# Patient Record
Sex: Female | Born: 1965
Health system: Southern US, Community
[De-identification: ages and names within clinical notes are randomized; demographics above are authoritative.]

## PROBLEM LIST (undated history)

## (undated) DIAGNOSIS — T7840XA Allergy, unspecified, initial encounter: Secondary | ICD-10-CM

## (undated) HISTORY — DX: Allergy, unspecified, initial encounter: T78.40XA

---

## 2001-12-10 HISTORY — PX: ABDOMINAL HYSTERECTOMY: SHX81

## 2005-03-13 ENCOUNTER — Ambulatory Visit (HOSPITAL_COMMUNITY): Admission: RE | Admit: 2005-03-13 | Discharge: 2005-03-13 | Payer: Self-pay | Admitting: Family Medicine

## 2005-12-25 ENCOUNTER — Other Ambulatory Visit: Admission: RE | Admit: 2005-12-25 | Discharge: 2005-12-25 | Payer: Self-pay | Admitting: Family Medicine

## 2006-03-28 ENCOUNTER — Ambulatory Visit (HOSPITAL_COMMUNITY): Admission: RE | Admit: 2006-03-28 | Discharge: 2006-03-28 | Payer: Self-pay | Admitting: Family Medicine

## 2007-03-31 ENCOUNTER — Encounter: Admission: RE | Admit: 2007-03-31 | Discharge: 2007-03-31 | Payer: Self-pay | Admitting: Family Medicine

## 2008-04-14 ENCOUNTER — Ambulatory Visit (HOSPITAL_COMMUNITY): Admission: RE | Admit: 2008-04-14 | Discharge: 2008-04-14 | Payer: Self-pay | Admitting: Family Medicine

## 2009-05-19 ENCOUNTER — Ambulatory Visit: Payer: Self-pay | Admitting: Family Medicine

## 2009-05-19 DIAGNOSIS — J309 Allergic rhinitis, unspecified: Secondary | ICD-10-CM | POA: Insufficient documentation

## 2009-10-13 ENCOUNTER — Ambulatory Visit: Payer: Self-pay | Admitting: Family Medicine

## 2009-10-13 DIAGNOSIS — I868 Varicose veins of other specified sites: Secondary | ICD-10-CM | POA: Insufficient documentation

## 2009-10-17 ENCOUNTER — Encounter (INDEPENDENT_AMBULATORY_CARE_PROVIDER_SITE_OTHER): Payer: Self-pay | Admitting: *Deleted

## 2009-10-17 LAB — HM COLONOSCOPY: HM Colonoscopy: NORMAL

## 2010-02-07 ENCOUNTER — Ambulatory Visit: Payer: Self-pay | Admitting: Family Medicine

## 2010-02-07 DIAGNOSIS — H10029 Other mucopurulent conjunctivitis, unspecified eye: Secondary | ICD-10-CM | POA: Insufficient documentation

## 2010-02-08 ENCOUNTER — Telehealth: Payer: Self-pay | Admitting: Family Medicine

## 2010-03-08 ENCOUNTER — Ambulatory Visit (HOSPITAL_COMMUNITY): Admission: RE | Admit: 2010-03-08 | Discharge: 2010-03-08 | Payer: Self-pay | Admitting: Family Medicine

## 2011-01-02 ENCOUNTER — Encounter: Payer: Self-pay | Admitting: Family Medicine

## 2011-01-06 ENCOUNTER — Encounter: Payer: Self-pay | Admitting: Family Medicine

## 2011-01-09 NOTE — Progress Notes (Signed)
Summary: vomiting related to Hydromet?  Phone Note Call from Patient   Caller: Patient Call For: Evelena Peat MD Summary of Call: [phone] Pt has been vomiting since taking the Hydromet.  Can she have another Rx? [facility] ([place]) Initial call taken by: Lynann Beaver CMA,  February 08, 2010 10:02 AM  Follow-up for Phone Call        Yes.  Try Tessalon Perles 200 mg one by mouth q 8 hours as needed cough, #30 with one refill. Follow-up by: Evelena Peat MD,  February 08, 2010 10:23 AM    New/Updated Medications: TESSALON 200 MG CAPS (BENZONATATE) one by mouth q 8 hours Prescriptions: TESSALON 200 MG CAPS (BENZONATATE) one by mouth q 8 hours  #30 x 0   Entered by:   Lynann Beaver CMA   Authorized by:   Evelena Peat MD   Signed by:   Lynann Beaver CMA on 02/08/2010   Method used:   Electronically to        [id number] (retail)       [address]       [place], [place]  [address]       Ph: [phone]       Fax: [phone]   RxID:   [id number]  Pt. notified.

## 2011-01-09 NOTE — Assessment & Plan Note (Signed)
Summary: pink eye//ccm   Vital Signs:  Patient profile:   45 year old female Menstrual status:  hysterectomy 2003 Temp:     98.9 degrees F oral BP sitting:   120 / 80  (left arm)  Vitals Entered By: Sid Falcon LPN (February 07, 346 11:40 AM) CC: pink eye   History of Present Illness: Patient seen with eye irritation mostly left past few days. Husband has conjunctivitis. 4 day hx redness and matting and drainage from the left eye. No blurred vision. No contact use. No known drug allergies.  Other issue is she had viral URI symptoms last week. Has persistent cough mostly nonproductive. No relief with over-the-counter Robitussin-DM. Denies fever. Nonsmoker.  Allergies (verified): No Known Drug Allergies  Past History:  Past Medical History: Last updated: 05/19/2009 Frequent headaches/Migraines Genital warts Allergic rhinitis  Review of Systems      See HPI  Physical Exam  General:  Well-developed,well-nourished,in no acute distress; alert,appropriate and cooperative throughout examination Eyes:  left conjunctiva erythematous right minimally erythematous. No purulent drainage at this time. Pupils equal reactive to light. Cornea appears normal Ears:  External ear exam shows no significant lesions or deformities.  Otoscopic examination reveals clear canals, tympanic membranes are intact bilaterally without bulging, retraction, inflammation or discharge. Hearing is grossly normal bilaterally. Mouth:  Oral mucosa and oropharynx without lesions or exudates.  Teeth in good repair. Neck:  No deformities, masses, or tenderness noted. Lungs:  Normal respiratory effort, chest expands symmetrically. Lungs are clear to auscultation, no crackles or wheezes. Heart:  Normal rate and regular rhythm. S1 and S2 normal without gallop, murmur, click, rub or other extra sounds.   Impression & Recommendations:  Problem # 1:  CONJUNCTIVITIS, BACTERIAL (ICD-372.03)  Her updated medication list  for this problem includes:    Polytrim 10000-0.1 Unit/ml-% Soln (Polymyxin b-trimethoprim) .Marland Kitchen... 2 drops ou q 4 hours while awake for 5 days  Problem # 2:  COUGH (ICD-786.2) hycodan for nightime symptom relief.  Complete Medication List: 1)  Flonase 50 Mcg/act Susp (Fluticasone propionate) .... Two times a day as needed 2)  Allegra 180 Mg Tabs (Fexofenadine hcl) .... Once daily 3)  Polytrim 10000-0.1 Unit/ml-% Soln (Polymyxin b-trimethoprim) .... 2 drops ou q 4 hours while awake for 5 days 4)  Hydrocodone-homatropine 5-1.5 Mg/55ml Syrp (Hydrocodone-homatropine) .... One tsp by mouth q 4-6 hours as needed cough  Patient Instructions: 1)  Clean any discharge from eyelids with baby shampoo and warm water. Be sure to wash hands often to avoid spreading and reinfection. If you wear contacts, remove them and wear glasses until infection resolved( be sure and clean lenses before replacing).  Prescriptions: POLYTRIM 10000-0.1 UNIT/ML-% SOLN (POLYMYXIN B-TRIMETHOPRIM) 2 drops OU q 4 hours while awake for 5 days  #47ml x 1   Entered and Authorized by:   Evelena Peat MD   Signed by:   Evelena Peat MD on 02/07/2010   Method used:   Electronically to        [id number] (retail)       [address]       [place], [place]  [address]       Ph: [phone]       Fax: [phone]   RxID:   [id number] HYDROCODONE-HOMATROPINE 5-1.5 MG/5ML SYRP (HYDROCODONE-HOMATROPINE) one tsp by mouth q 4-6 hours as needed cough  #120 ml x 0   Entered and Authorized by:   Evelena Peat MD   Signed by:  Evelena Peat MD on 02/07/2010   Method used:   Print then Give to Patient   RxID:   [id number]

## 2011-01-10 ENCOUNTER — Ambulatory Visit (INDEPENDENT_AMBULATORY_CARE_PROVIDER_SITE_OTHER): Payer: PRIVATE HEALTH INSURANCE | Admitting: Family Medicine

## 2011-01-10 ENCOUNTER — Encounter: Payer: Self-pay | Admitting: Family Medicine

## 2011-01-10 DIAGNOSIS — J309 Allergic rhinitis, unspecified: Secondary | ICD-10-CM

## 2011-01-10 MED ORDER — FLUTICASONE PROPIONATE 50 MCG/ACT NA SUSP: 2.0000 | Freq: Every day | NASAL | Status: DC

## 2011-01-10 NOTE — Patient Instructions (Signed)
Consider scheduling follow up for CPE later this year.

## 2011-01-10 NOTE — Progress Notes (Signed)
Patient is here for routine medical followup. She has history of perennial allergies.  On Flonase and over-the-counter Zyrtec. Symptoms generally well-controlled. Conjunctivitis last visit fully resolved. Patient has no specific complaints today otherwise. Allergies manifested clear nasal discharge and occasional sneezing. Rare symptoms. No history of asthma. Ex-smoker.  No CPE in several years.  She is willing to schedule.  ROS- denies any fever, chills, cough, dyspnea, chest pain.  PE-HEENT exam unremarkable. Oropharynx clear. TMs normal. Neck no adenopathy. Chest clear to auscultation. Heart regular rhythm and rate with no murmur. Skin no rashes.

## 2011-01-10 NOTE — Progress Notes (Deleted)
  Subjective:    Patient ID: Ana Rivera, female    DOB: Nov 19, 1966, 45 y.o.   MRN: [id number]  HPI    Review of Systems     Objective:   Physical Exam        Assessment & Plan:

## 2011-01-10 NOTE — Progress Notes (Signed)
Addended by: Alfred Levins on: 01/10/2011 09:47 AM   Modules accepted: Orders

## 2011-03-30 ENCOUNTER — Encounter: Payer: Self-pay | Admitting: Internal Medicine

## 2011-03-30 ENCOUNTER — Ambulatory Visit (INDEPENDENT_AMBULATORY_CARE_PROVIDER_SITE_OTHER): Payer: PRIVATE HEALTH INSURANCE | Admitting: Internal Medicine

## 2011-03-30 VITALS — BP 120/80 | HR 103 | Ht 60.0 in | Wt 162.0 lb

## 2011-03-30 DIAGNOSIS — R1031 Right lower quadrant pain: Secondary | ICD-10-CM

## 2011-03-30 DIAGNOSIS — R3915 Urgency of urination: Secondary | ICD-10-CM

## 2011-03-30 DIAGNOSIS — R109 Unspecified abdominal pain: Secondary | ICD-10-CM

## 2011-03-30 LAB — POCT URINALYSIS DIPSTICK
Bilirubin, UA: NEGATIVE
Glucose, UA: NEGATIVE
Ketones, UA: NEGATIVE
Nitrite, UA: NEGATIVE
Protein, UA: NEGATIVE
Spec Grav, UA: 1.01
Urobilinogen, UA: 0.2
pH, UA: 6.5

## 2011-03-30 MED ORDER — CIPROFLOXACIN HCL 500 MG PO TABS: 500.0000 mg | ORAL_TABLET | Freq: Two times a day (BID) | ORAL | Status: AC

## 2011-03-30 NOTE — Progress Notes (Signed)
  Subjective:    Patient ID: Ana Rivera, female    DOB: Aug 02, 1966, 45 y.o.   MRN: [id number]  HPI Patient presents to clinic for evaluation of bdominal discomfort.Notes 24 hour history of bilateral lower quadrant discomfort without radiation.As noted also 24-hour history of urinary urgency without increase in frequency, dysuria or hematuria. Denies fever or chills. Has undergone partial hysterectomy in the past for noncancerous indication. Urinalysis obtained and reviewed with patient.Noted presence of +1 blood and trace leukocyte esterase. No exacerbating or alleviating factors. Patient is taking no medication for this problem. No other complaints.  Reviewed past medical history, medications and allergies    Review of Systems see history of present illness     Objective:   Physical Exam  Nursing note and vitals reviewed. Constitutional: She appears well-developed and well-nourished. No distress.  HENT:  Head: Normocephalic and atraumatic.  Abdominal: Soft. Bowel sounds are normal. She exhibits no distension and no mass. There is no tenderness. There is no rebound and no guarding.  Skin: Skin is warm. She is not diaphoretic.          Assessment & Plan:

## 2011-03-31 DIAGNOSIS — R1031 Right lower quadrant pain: Secondary | ICD-10-CM | POA: Insufficient documentation

## 2011-03-31 NOTE — Assessment & Plan Note (Signed)
Possible UTI. Begin antibiotics as directed to completion. If symptoms persist consider elvic ultrasound. Followup if no improvement or worsening.

## 2011-04-06 ENCOUNTER — Telehealth: Payer: Self-pay | Admitting: *Deleted

## 2011-04-06 NOTE — Telephone Encounter (Signed)
Pt has finished antibiotics.

## 2011-04-06 NOTE — Telephone Encounter (Signed)
Pt wants Dr. Rodena Medin to know she does not think she has an UTI.  She feels it may be GI, and she plans to make an appt for a CPX.

## 2011-04-06 NOTE — Telephone Encounter (Signed)
That's fine but pls take abx to completion. Helps [place] decide if we need further tests like [place] we talked about.

## 2011-05-03 ENCOUNTER — Encounter: Payer: Self-pay | Admitting: Family Medicine

## 2011-05-03 ENCOUNTER — Ambulatory Visit (INDEPENDENT_AMBULATORY_CARE_PROVIDER_SITE_OTHER): Payer: PRIVATE HEALTH INSURANCE | Admitting: Family Medicine

## 2011-05-03 VITALS — BP 92/68 | HR 92 | Temp 98.7°F | Resp 12 | Ht 60.0 in | Wt 159.0 lb

## 2011-05-03 DIAGNOSIS — Z0000 Encounter for general adult medical examination without abnormal findings: Secondary | ICD-10-CM

## 2011-05-03 LAB — HEPATIC FUNCTION PANEL
Alkaline Phosphatase: 53 U/L (ref 39–117)
Bilirubin, Direct: 0.1 mg/dL (ref 0.0–0.3)

## 2011-05-03 LAB — CBC WITH DIFFERENTIAL/PLATELET
Eosinophils Relative: 1.5 % (ref 0.0–5.0)
Hemoglobin: 13.5 g/dL (ref 12.0–15.0)
Lymphs Abs: 1.8 10*3/uL (ref 0.7–4.0)
MCHC: 34.8 g/dL (ref 30.0–36.0)
Monocytes Absolute: 0.6 10*3/uL (ref 0.1–1.0)
Monocytes Relative: 7.7 % (ref 3.0–12.0)
Platelets: 321 10*3/uL (ref 150.0–400.0)
WBC: 7.3 10*3/uL (ref 4.5–10.5)

## 2011-05-03 LAB — LIPID PANEL
Cholesterol: 156 mg/dL (ref 0–200)
HDL: 57.6 mg/dL (ref >39.00)
Triglycerides: 66 mg/dL (ref 0.0–149.0)
VLDL: 13.2 mg/dL (ref 0.0–40.0)

## 2011-05-03 LAB — BASIC METABOLIC PANEL
Chloride: 108 mEq/L (ref 96–112)
Creatinine, Ser: 0.8 mg/dL (ref 0.4–1.2)
GFR: 79.61 mL/min (ref >60.00)
Glucose, Bld: 95 mg/dL (ref 70–99)

## 2011-05-03 NOTE — Progress Notes (Signed)
  Subjective:    Patient ID: Ana Rivera, female    DOB: Aug 11, 1966, 45 y.o.   MRN: [id number]  HPI Patient here for complete physical examination. She has history of allergic rhinitis and no other chronic medical problems. Last tetanus 2004. Prior colonoscopy 2010. Last mammogram last year. Needs repeat.  Social history is that she is married. No children. Nonsmoker. Occasional alcohol use. Family history is reviewed as recorded elsewhere.  Patient is exercising most days of the week. No specific complaints. Prior hysterectomy for benign disease so no indication for Pap smear  Past Medical History  Diagnosis Date  . Allergy    Past Surgical History  Procedure Date  . Abdominal hysterectomy 2003    reports that she has never smoked. She does not have any smokeless tobacco history on file. Her alcohol and drug histories not on file. family history includes Cancer (age of onset:65) in her father; Cancer (age of onset:68) in her mother; and Heart disease (age of onset:70) in her father. Allergies  Allergen Reactions  . Codeine Nausea And Vomiting      Review of Systems  Constitutional: Negative for fever, activity change, appetite change and fatigue.  HENT: Negative for hearing loss, ear pain, sore throat and trouble swallowing.   Eyes: Negative for visual disturbance.  Respiratory: Negative for cough and shortness of breath.   Cardiovascular: Negative for chest pain and palpitations.  Gastrointestinal: Negative for abdominal pain, diarrhea, constipation and blood in stool.  Genitourinary: Negative for dysuria and hematuria.  Musculoskeletal: Negative for myalgias, back pain and arthralgias.  Skin: Negative for rash.  Neurological: Negative for dizziness, syncope and headaches.  Hematological: Negative for adenopathy.  Psychiatric/Behavioral: Negative for confusion and dysphoric mood.       Objective:   Physical Exam  Constitutional: She is oriented to person, place, and  time. She appears well-developed and well-nourished.  HENT:  Head: Normocephalic and atraumatic.  Eyes: EOM are normal. Pupils are equal, round, and reactive to light.  Neck: Normal range of motion. Neck supple. No thyromegaly present.  Cardiovascular: Normal rate, regular rhythm and normal heart sounds.   No murmur heard. Pulmonary/Chest: Breath sounds normal. No respiratory distress. She has no wheezes. She has no rales.  Abdominal: Soft. Bowel sounds are normal. She exhibits no distension and no mass. There is no tenderness. There is no rebound and no guarding.  Musculoskeletal: Normal range of motion. She exhibits no edema.  Lymphadenopathy:    She has no cervical adenopathy.  Neurological: She is alert and oriented to person, place, and time. She displays normal reflexes. No cranial nerve deficit.  Skin: No rash noted.  Psychiatric: She has a normal mood and affect. Her behavior is normal. Judgment and thought content normal.          Assessment & Plan:  Physical examination. Schedule mammogram. Regular exercise. Recommended consideration for tetanus and she prefers to wait until next year. Obtain screening lab work.

## 2011-05-03 NOTE — Patient Instructions (Signed)
Schedule repeat mammogram. Continue regular exercise. Consider tetanus booster next year

## 2011-05-04 NOTE — Progress Notes (Signed)
Quick Note:  Spoke with pt , informed of lab results .KIK ______

## 2011-05-10 ENCOUNTER — Other Ambulatory Visit: Payer: Self-pay | Admitting: Family Medicine

## 2011-05-10 DIAGNOSIS — Z1231 Encounter for screening mammogram for malignant neoplasm of breast: Secondary | ICD-10-CM

## 2011-05-22 ENCOUNTER — Ambulatory Visit (HOSPITAL_COMMUNITY)
Admission: RE | Admit: 2011-05-22 | Discharge: 2011-05-22 | Disposition: A | Discharge status: Home / Self Care | Payer: PRIVATE HEALTH INSURANCE | Source: Ambulatory Visit | Attending: Family Medicine | Admitting: Family Medicine

## 2011-05-22 DIAGNOSIS — Z1231 Encounter for screening mammogram for malignant neoplasm of breast: Secondary | ICD-10-CM | POA: Insufficient documentation

## 2011-12-17 ENCOUNTER — Encounter: Payer: Self-pay | Admitting: Family Medicine

## 2011-12-17 ENCOUNTER — Telehealth: Payer: Self-pay | Admitting: Family Medicine

## 2011-12-17 ENCOUNTER — Ambulatory Visit (INDEPENDENT_AMBULATORY_CARE_PROVIDER_SITE_OTHER): Payer: PRIVATE HEALTH INSURANCE | Admitting: Family Medicine

## 2011-12-17 VITALS — BP 118/70 | Temp 98.8°F | Wt 161.0 lb

## 2011-12-17 DIAGNOSIS — J209 Acute bronchitis, unspecified: Secondary | ICD-10-CM

## 2011-12-17 DIAGNOSIS — E669 Obesity, unspecified: Secondary | ICD-10-CM

## 2011-12-17 MED ORDER — PHENTERMINE HCL 37.5 MG PO CAPS: 37.5000 mg | ORAL_CAPSULE | ORAL | Status: DC

## 2011-12-17 MED ORDER — HYDROCODONE-HOMATROPINE 5-1.5 MG/5ML PO SYRP: 5.0000 mL | ORAL_SOLUTION | Freq: Four times a day (QID) | ORAL | Status: AC | PRN

## 2011-12-17 NOTE — Telephone Encounter (Signed)
Pt was in earlier today was was prescribed Hydro condone cough syrup pt is worried that she is allergic and is requesting you contact her

## 2011-12-17 NOTE — Patient Instructions (Signed)

## 2011-12-17 NOTE — Telephone Encounter (Signed)
Pt had trouble with Hycodan cough syrup in March 2011, caused nausea and vomiting, reported to office and was given Tessalon.  Pt is experiencing some headache, mild nausea.  She is not sure if she was sensitive to the cough syrup or also stomach bug last year.  I will call pt tomorrow to see how she is doing.

## 2011-12-17 NOTE — Progress Notes (Signed)
  Subjective:    Patient ID: Ana Rivera, female    DOB: 1965/12/31, 46 y.o.   MRN: [id number]  HPI  Patient seen with non-history of dry cough. Minimal nasal congestion. Intermittent body aches. Denies any fever or chills. No dyspnea. No orthopnea. Patient nonsmoker.  She's had challenge with weight loss for several months/years. She has been involved with regular exercise without improvement. Requesting specifically phentermine. She has never tried this previously. No history of hypertension.  She has tried calorie restricted diets without improvement.  Past Medical History  Diagnosis Date  . Allergy    Past Surgical History  Procedure Date  . Abdominal hysterectomy 2003    reports that she has never smoked. She does not have any smokeless tobacco history on file. Her alcohol and drug histories not on file. family history includes Cancer (age of onset:65) in her father; Cancer (age of onset:68) in her mother; and Heart disease (age of onset:70) in her father. Allergies  Allergen Reactions  . Codeine Nausea And Vomiting      Review of Systems  Constitutional: Negative for fever, chills, appetite change and fatigue.  Respiratory: Positive for cough. Negative for shortness of breath and wheezing.   Cardiovascular: Negative for chest pain, palpitations and leg swelling.  Gastrointestinal: Negative for abdominal pain.  Neurological: Negative for dizziness, syncope, weakness and headaches.  Psychiatric/Behavioral: Negative for dysphoric mood and agitation.       Objective:   Physical Exam  Constitutional: She appears well-developed and well-nourished.  HENT:  Right Ear: External ear normal.  Left Ear: External ear normal.  Mouth/Throat: Oropharynx is clear and moist.  Neck: Neck supple.  Cardiovascular: Normal rate and regular rhythm.   Pulmonary/Chest: Effort normal and breath sounds normal. No respiratory distress. She has no wheezes. She has no rales.  Musculoskeletal:  She exhibits no edema.  Lymphadenopathy:    She has no cervical adenopathy.          Assessment & Plan:  #1 acute viral bronchitis. Hycodan cough syrup 1 teaspoon every 6 hours for cough suppression. Cautioned about possible sedation. Follow up promptly for any fever worsening symptoms #2 overweight. Discussed options. Discussed importance of diet and regular exercise. Requesting phentermine. We explained possible side effects and potential risk. We agreed to 37.5 mg once daily and reassess in one month. She is aware this is only for short-term use and does not take the place of diet and exercise

## 2011-12-18 NOTE — Telephone Encounter (Signed)
I spoke with pt.  She does not want to try the Hycodan cough syrup because she had such a headache after one dose.  She took some Nightquil and slept pretty good.  She also has Tessalon Pearls left from last years cough and will use those today.  Will correct allergy on chart to read Hydrocodone.

## 2012-01-15 ENCOUNTER — Ambulatory Visit (INDEPENDENT_AMBULATORY_CARE_PROVIDER_SITE_OTHER): Payer: PRIVATE HEALTH INSURANCE | Admitting: Family Medicine

## 2012-01-15 ENCOUNTER — Encounter: Payer: Self-pay | Admitting: Family Medicine

## 2012-01-15 VITALS — BP 120/80 | Temp 98.4°F | Wt 157.0 lb

## 2012-01-15 DIAGNOSIS — E669 Obesity, unspecified: Secondary | ICD-10-CM

## 2012-01-15 MED ORDER — PHENTERMINE HCL 37.5 MG PO CAPS: 37.5000 mg | ORAL_CAPSULE | ORAL | Status: DC

## 2012-01-15 NOTE — Progress Notes (Signed)
  Subjective:    Patient ID: Ana Rivera, female    DOB: Apr 11, 1966, 46 y.o.   MRN: [id number]  HPI  Followup for recent initiation of phentermine for weight loss. No side effects other than minimal dry mouth. Blood pressure stable. No palpitations. No chest pains. No headaches. She has lost 4 pounds. She is combining medication with diet and exercise. Feels better overall.  Past Medical History  Diagnosis Date  . Allergy    Past Surgical History  Procedure Date  . Abdominal hysterectomy 2003    reports that she has never smoked. She does not have any smokeless tobacco history on file. Her alcohol and drug histories not on file. family history includes Cancer (age of onset:65) in her father; Cancer (age of onset:68) in her mother; and Heart disease (age of onset:70) in her father. Allergies  Allergen Reactions  . Hydrocodone     Hycodan cough syrup caused vomiting and headache      Review of Systems  Respiratory: Negative for cough and shortness of breath.   Cardiovascular: Negative for chest pain and palpitations.  Neurological: Negative for headaches.       Objective:   Physical Exam  Constitutional: She appears well-developed and well-nourished. No distress.  Neck: Neck supple. No thyromegaly present.  Cardiovascular: Normal rate and regular rhythm.   Pulmonary/Chest: Effort normal and breath sounds normal. No respiratory distress. She has no wheezes. She has no rales.          Assessment & Plan:  Obesity. Improving with phentermine with no adverse side effects. We'll continue for 3-4 months then vacation off medication. Continue diet and exercise

## 2012-02-02 ENCOUNTER — Other Ambulatory Visit: Payer: Self-pay | Admitting: Family Medicine

## 2012-02-15 ENCOUNTER — Telehealth: Payer: Self-pay | Admitting: Family Medicine

## 2012-02-15 MED ORDER — PHENTERMINE HCL 37.5 MG PO CAPS: 37.5000 mg | ORAL_CAPSULE | ORAL | Status: DC

## 2012-02-15 NOTE — Telephone Encounter (Signed)
Refill once 

## 2012-02-15 NOTE — Telephone Encounter (Signed)
Please advise, was given #30 only at [facility] in Feb.

## 2012-02-15 NOTE — Telephone Encounter (Signed)
Pt informed ready to pick up

## 2012-02-15 NOTE — Telephone Encounter (Signed)
Pt called req refill for phentermine 37.5 MG capsule. Pls call pt when ready for pick up.

## 2012-04-16 ENCOUNTER — Telehealth: Payer: Self-pay | Admitting: Family Medicine

## 2012-04-16 NOTE — Telephone Encounter (Signed)
Pt called req refill of phentermine 37.5 MG capsule to [facility] [address]

## 2012-04-17 MED ORDER — PHENTERMINE HCL 37.5 MG PO CAPS: 37.5000 mg | ORAL_CAPSULE | ORAL | Status: DC

## 2012-04-17 NOTE — Telephone Encounter (Signed)
Refill once 

## 2012-06-05 ENCOUNTER — Other Ambulatory Visit: Payer: Self-pay | Admitting: *Deleted

## 2012-06-05 NOTE — Telephone Encounter (Signed)
Refill once.  Will need office follow up prior to further refills.

## 2012-06-05 NOTE — Telephone Encounter (Signed)
Phentermine, 1 cap daily every morning.  Last filled 04-17-12, #30 with 0 refills

## 2012-06-06 MED ORDER — PHENTERMINE HCL 37.5 MG PO CAPS: 37.5000 mg | ORAL_CAPSULE | ORAL | Status: DC

## 2012-06-24 ENCOUNTER — Other Ambulatory Visit: Payer: Self-pay | Admitting: Family Medicine

## 2012-06-24 DIAGNOSIS — Z1231 Encounter for screening mammogram for malignant neoplasm of breast: Secondary | ICD-10-CM

## 2012-07-08 ENCOUNTER — Ambulatory Visit (INDEPENDENT_AMBULATORY_CARE_PROVIDER_SITE_OTHER): Payer: PRIVATE HEALTH INSURANCE | Admitting: Family Medicine

## 2012-07-08 ENCOUNTER — Encounter: Payer: Self-pay | Admitting: Family Medicine

## 2012-07-08 VITALS — BP 118/78 | Temp 98.5°F | Wt 150.0 lb

## 2012-07-08 DIAGNOSIS — E663 Overweight: Secondary | ICD-10-CM

## 2012-07-08 MED ORDER — PHENTERMINE HCL 37.5 MG PO CAPS: 37.5000 mg | ORAL_CAPSULE | ORAL | Status: DC

## 2012-07-08 NOTE — Progress Notes (Signed)
  Subjective:    Patient ID: Ana Rivera, female    DOB: 09-Aug-1966, 46 y.o.   MRN: [id number]  HPI  Followup phentermine therapy. Patient tolerated well. No headaches. No chest pains. No dyspnea. She is exercising with walking. She has lost about 11 pounds since starting this. She's taking this somewhat intermittently. She knows this is not a long-term treatment option.  Review of Systems  Respiratory: Negative for shortness of breath.   Cardiovascular: Negative for chest pain and palpitations.  Neurological: Negative for headaches.       Objective:   Physical Exam  Constitutional: She appears well-developed and well-nourished.  Neck: Neck supple. No thyromegaly present.  Cardiovascular: Normal rate and regular rhythm.  Exam reveals no gallop.   No murmur heard. Pulmonary/Chest: Effort normal and breath sounds normal. No respiratory distress. She has no wheezes. She has no rales.          Assessment & Plan:  Overweight. Patient is having excellent success with weight loss. We have advised of importance of diet and exercise as first line. 1 refill phentermine 37.5 mg once daily. She will leave off after her current prescription runs out.

## 2012-07-16 ENCOUNTER — Ambulatory Visit (HOSPITAL_COMMUNITY)
Admission: RE | Admit: 2012-07-16 | Discharge: 2012-07-16 | Disposition: A | Discharge status: Home / Self Care | Payer: PRIVATE HEALTH INSURANCE | Source: Ambulatory Visit | Attending: Family Medicine | Admitting: Family Medicine

## 2012-07-16 DIAGNOSIS — Z1231 Encounter for screening mammogram for malignant neoplasm of breast: Secondary | ICD-10-CM | POA: Insufficient documentation

## 2012-08-21 ENCOUNTER — Other Ambulatory Visit: Payer: Self-pay | Admitting: *Deleted

## 2012-08-21 MED ORDER — PHENTERMINE HCL 37.5 MG PO CAPS: 37.5000 mg | ORAL_CAPSULE | ORAL | Status: DC

## 2012-08-21 NOTE — Telephone Encounter (Signed)
Phentermine last filled 07/08/12, #30 with 0 refills

## 2012-08-21 NOTE — Telephone Encounter (Signed)
Refill OK

## 2012-10-27 ENCOUNTER — Ambulatory Visit: Payer: PRIVATE HEALTH INSURANCE | Admitting: Family Medicine

## 2012-11-03 ENCOUNTER — Ambulatory Visit: Payer: PRIVATE HEALTH INSURANCE

## 2012-11-03 ENCOUNTER — Ambulatory Visit: Payer: Self-pay | Admitting: Family Medicine

## 2012-11-03 ENCOUNTER — Ambulatory Visit (INDEPENDENT_AMBULATORY_CARE_PROVIDER_SITE_OTHER): Payer: PRIVATE HEALTH INSURANCE | Admitting: Family Medicine

## 2012-11-03 ENCOUNTER — Telehealth: Payer: Self-pay | Admitting: Family Medicine

## 2012-11-03 VITALS — BP 156/86 | HR 100 | Temp 98.4°F | Resp 18 | Ht 60.5 in | Wt 152.0 lb

## 2012-11-03 DIAGNOSIS — S93609A Unspecified sprain of unspecified foot, initial encounter: Secondary | ICD-10-CM

## 2012-11-03 DIAGNOSIS — M79672 Pain in left foot: Secondary | ICD-10-CM

## 2012-11-03 DIAGNOSIS — S93602A Unspecified sprain of left foot, initial encounter: Secondary | ICD-10-CM

## 2012-11-03 DIAGNOSIS — M79609 Pain in unspecified limb: Secondary | ICD-10-CM

## 2012-11-03 NOTE — Telephone Encounter (Signed)
Patient Information:  Caller Name: Rannah  Phone: [phone]  Patient: Ana Rivera, Ana Rivera  Gender: Female  DOB: 05/06/1966  Age: 46 Years  PCP: Evelena Peat (Family Practice)  Pregnant: No   Symptoms  Reason For Call & Symptoms: Larey Seat on Saturday 11/23-missing a step and injured left foot.  Foot hurts not ankle.  Reviewed Health History In EMR: Yes  Reviewed Medications In EMR: Yes  Reviewed Allergies In EMR: Yes  Date of Onset of Symptoms: 11/01/2012  Treatments Tried: Anti-inflammatory  Treatments Tried Worked: Yes OB:  LMP: Unknown  Guideline(s) Used:  Foot Pain  Ankle and Foot Injury  Disposition Per Guideline:   See Today in Office  Reason For Disposition Reached:   Large swelling or bruise and size > palm of person's hand  Advice Given:  Apply a Cold Pack:  Apply a cold pack or an ice bag (wrapped in a moist towel) to the area for 20 minutes. Repeat in 1 hour, then every 4 hours while awake.  Reassurance - Direct Blow (Contusion, Bruise)  Symptoms are mild pain, swelling, and/or bruising.  Apply Heat to the Area:  Beginning 48 hours after an injury, apply a warm washcloth or heating pad for 10 minutes three times a day.  Wrap with an Elastic Bandage:  The pressure from the bandage can make it feel better and help prevent swelling.  Office Follow Up:  Does the office need to follow up with this patient?: No  Instructions For The Office: N/A  Appointment Scheduled:  11/03/2012 14:30:00  RN Note:  Rates foot pain at rest a 2/10 and walking 4-5/10.  It is swollen and purple with more swelling toward ankle bone.  Patient has it wrapped and elevated.

## 2012-11-03 NOTE — Patient Instructions (Addendum)
Postop shoe Ibuprofen Return if worse

## 2012-11-03 NOTE — Progress Notes (Signed)
Subjective: Patient was out of town this weekend and in the dark she missed the bottom step. She twisted her ankle and foot and fell. She had pain in the left foot, primarily in the midfoot area. There is been some swelling and discomfort. She did not ice it, but was in a cold football game all day. 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 She's been trying not to stand on it excessively.  Objective: She has broken her wrist a few years ago. Her left ankle joint seems normal with no tenderness of the malleolus. He midfoot is tender and a little edematous on the proximal fourth and fifth metatarsal area primarily. Slightly tender at the proximal first and fifth metatarsal.. Neurovascular is normal. Movement of the toes is normal. She can flex and extend the ankle.  [facility] reading (PRIMARY) by  Dr. Sofie Rower reveals no fracture.   Assessment: Sprain and strain of the midfoot: Left.  Plan: Postop shoe Ibuprofen Return if worse

## 2012-11-03 NOTE — Telephone Encounter (Signed)
No follow up required, closing encounter. °

## 2012-12-07 ENCOUNTER — Other Ambulatory Visit: Payer: Self-pay | Admitting: Family Medicine

## 2013-04-06 ENCOUNTER — Encounter: Payer: Self-pay | Admitting: Family Medicine

## 2013-04-06 ENCOUNTER — Ambulatory Visit (INDEPENDENT_AMBULATORY_CARE_PROVIDER_SITE_OTHER): Payer: BC Managed Care – PPO | Admitting: Family Medicine

## 2013-04-06 VITALS — BP 120/70 | Temp 98.9°F

## 2013-04-06 DIAGNOSIS — J019 Acute sinusitis, unspecified: Secondary | ICD-10-CM

## 2013-04-06 MED ORDER — AZITHROMYCIN 250 MG PO TABS: ORAL_TABLET | ORAL | Status: AC

## 2013-04-06 NOTE — Progress Notes (Signed)
  Subjective:    Patient ID: Ana Rivera, female    DOB: 04-08-1966, 47 y.o.   MRN: [id number]  HPI Acute visit Almost three-week history of bifrontal and ethmoid sinus congestion bilaterally. She's had some thick mucus off and on. More frequent headaches. Denies fever or chills. No significant cough. No sore throat. She's tried Advil in Sudafed with minimal improvement  Just some seasonal allergies and uses Flonase and Zyrtec for that.  Past Medical History  Diagnosis Date  . Allergy    Past Surgical History  Procedure Laterality Date  . Abdominal hysterectomy  2003    reports that she has never smoked. She does not have any smokeless tobacco history on file. She reports that  drinks alcohol. Her drug history is not on file. family history includes Cancer (age of onset: 76) in her father; Cancer (age of onset: 19) in her mother; Diabetes in her maternal grandmother; and Heart disease (age of onset: 79) in her father. Allergies  Allergen Reactions  . Hydrocodone     Hycodan cough syrup caused vomiting and headache      Review of Systems  Constitutional: Positive for fatigue. Negative for fever and chills.  HENT: Positive for congestion and sinus pressure.   Respiratory: Negative for cough, shortness of breath and wheezing.   Neurological: Positive for headaches.       Objective:   Physical Exam  Constitutional: She appears well-developed and well-nourished.  HENT:  Right Ear: External ear normal.  Left Ear: External ear normal.  Mouth/Throat: Oropharynx is clear and moist.  Erythematous nasal mucosa otherwise normal  Neck: Neck supple.  Cardiovascular: Normal rate and regular rhythm.   Pulmonary/Chest: Effort normal and breath sounds normal. No respiratory distress. She has no wheezes. She has no rales.  Lymphadenopathy:    She has no cervical adenopathy.          Assessment & Plan:  Probable acute sinusitis. Zithromax for 5 days. Consider Netti pot for  nasal saline irrigation

## 2013-04-06 NOTE — Patient Instructions (Addendum)

## 2013-07-22 ENCOUNTER — Telehealth: Payer: Self-pay

## 2013-07-22 MED ORDER — PHENTERMINE HCL 37.5 MG PO CAPS: 37.5000 mg | ORAL_CAPSULE | ORAL | Status: DC

## 2013-07-22 NOTE — Telephone Encounter (Signed)
Phentermine 37.5 mg cap Take one capsule by mouth every morning  Last visit 04/06/13 08/21/12 last refill #30 no refill  [facility]

## 2013-07-22 NOTE — Telephone Encounter (Signed)
Phoned in to pharmacy. 

## 2013-07-22 NOTE — Telephone Encounter (Signed)
Refill once.  Needs office follow up to reassess

## 2013-08-14 ENCOUNTER — Other Ambulatory Visit: Payer: Self-pay | Admitting: Family Medicine

## 2013-08-14 DIAGNOSIS — Z1231 Encounter for screening mammogram for malignant neoplasm of breast: Secondary | ICD-10-CM

## 2013-08-20 ENCOUNTER — Ambulatory Visit (HOSPITAL_COMMUNITY): Payer: BC Managed Care – PPO

## 2013-08-25 ENCOUNTER — Ambulatory Visit (HOSPITAL_COMMUNITY)
Admission: RE | Admit: 2013-08-25 | Discharge: 2013-08-25 | Disposition: A | Discharge status: Home / Self Care | Payer: BC Managed Care – PPO | Source: Ambulatory Visit | Attending: Family Medicine | Admitting: Family Medicine

## 2013-08-25 DIAGNOSIS — Z1231 Encounter for screening mammogram for malignant neoplasm of breast: Secondary | ICD-10-CM | POA: Insufficient documentation

## 2013-10-16 ENCOUNTER — Encounter: Payer: Self-pay | Admitting: Family Medicine

## 2013-10-16 ENCOUNTER — Ambulatory Visit (INDEPENDENT_AMBULATORY_CARE_PROVIDER_SITE_OTHER): Payer: BC Managed Care – PPO | Admitting: Family Medicine

## 2013-10-16 VITALS — BP 140/78 | HR 106 | Temp 98.3°F | Wt 157.0 lb

## 2013-10-16 DIAGNOSIS — J309 Allergic rhinitis, unspecified: Secondary | ICD-10-CM

## 2013-10-16 DIAGNOSIS — G43909 Migraine, unspecified, not intractable, without status migrainosus: Secondary | ICD-10-CM | POA: Insufficient documentation

## 2013-10-16 MED ORDER — PREDNISONE 10 MG PO TABS: ORAL_TABLET | ORAL | Status: DC

## 2013-10-16 MED ORDER — SUMATRIPTAN SUCCINATE 100 MG PO TABS: 100.0000 mg | ORAL_TABLET | ORAL | Status: DC | PRN

## 2013-10-16 MED ORDER — AZELASTINE HCL 0.1 % NA SOLN: 2.0000 | Freq: Two times a day (BID) | NASAL | Status: DC

## 2013-10-16 NOTE — Progress Notes (Signed)
  Subjective:    Patient ID: Ana Rivera, female    DOB: 1966/08/26, 47 y.o.   MRN: [id number]  HPI Acute visit Has had some nasal congestion for several weeks and progressive headache past 2 days. She does have history of migraines. She's had some features of migraine with occasional throbbing and mild nausea. No vomiting. She has no cough. She's had persistent nasal congestion. She takes Zyrtec and Flonase. She's had occasional sneezing. She thinks is mostly allergic. No colored nasal discharge. No fevers or chills.  Past Medical History  Diagnosis Date  . Allergy    Past Surgical History  Procedure Laterality Date  . Abdominal hysterectomy  2003    reports that she has never smoked. She does not have any smokeless tobacco history on file. She reports that she drinks alcohol. Her drug history is not on file. family history includes Cancer (age of onset: 14) in her father; Cancer (age of onset: 49) in her mother; Diabetes in her maternal grandmother; Heart disease (age of onset: 70) in her father. Allergies  Allergen Reactions  . Hydrocodone     Hycodan cough syrup caused vomiting and headache     Review of Systems  Constitutional: Positive for fatigue. Negative for fever and chills.  HENT: Positive for sinus pressure. Negative for facial swelling.   Respiratory: Negative for cough.   Neurological: Positive for headaches.       Objective:   Physical Exam  Constitutional: She appears well-developed and well-nourished.  HENT:  Right Ear: External ear normal.  Left Ear: External ear normal.  Nose: Nose normal.  Mouth/Throat: Oropharynx is clear and moist.  Neck: Neck supple.  Cardiovascular: Normal rate.   Pulmonary/Chest: Effort normal and breath sounds normal. No respiratory distress. She has no wheezes. She has no rales.  Lymphadenopathy:    She has no cervical adenopathy.          Assessment & Plan:  Rhinitis. Suspect allergic. She's had and some associated  headaches which may be sinus related. No evidence for infection. Brief prednisone taper. Astelin nasal as needed. We also refill her Imitrex for as needed use for migraine headaches

## 2013-12-15 IMAGING — CR DG FOOT COMPLETE 3+V*L*
2 series · 2 of 2 positions shown · non-contrast
Comparison: None.

CLINICAL DATA: Foot pain.

LEFT FOOT - COMPLETE 3+ VIEW

[AP]
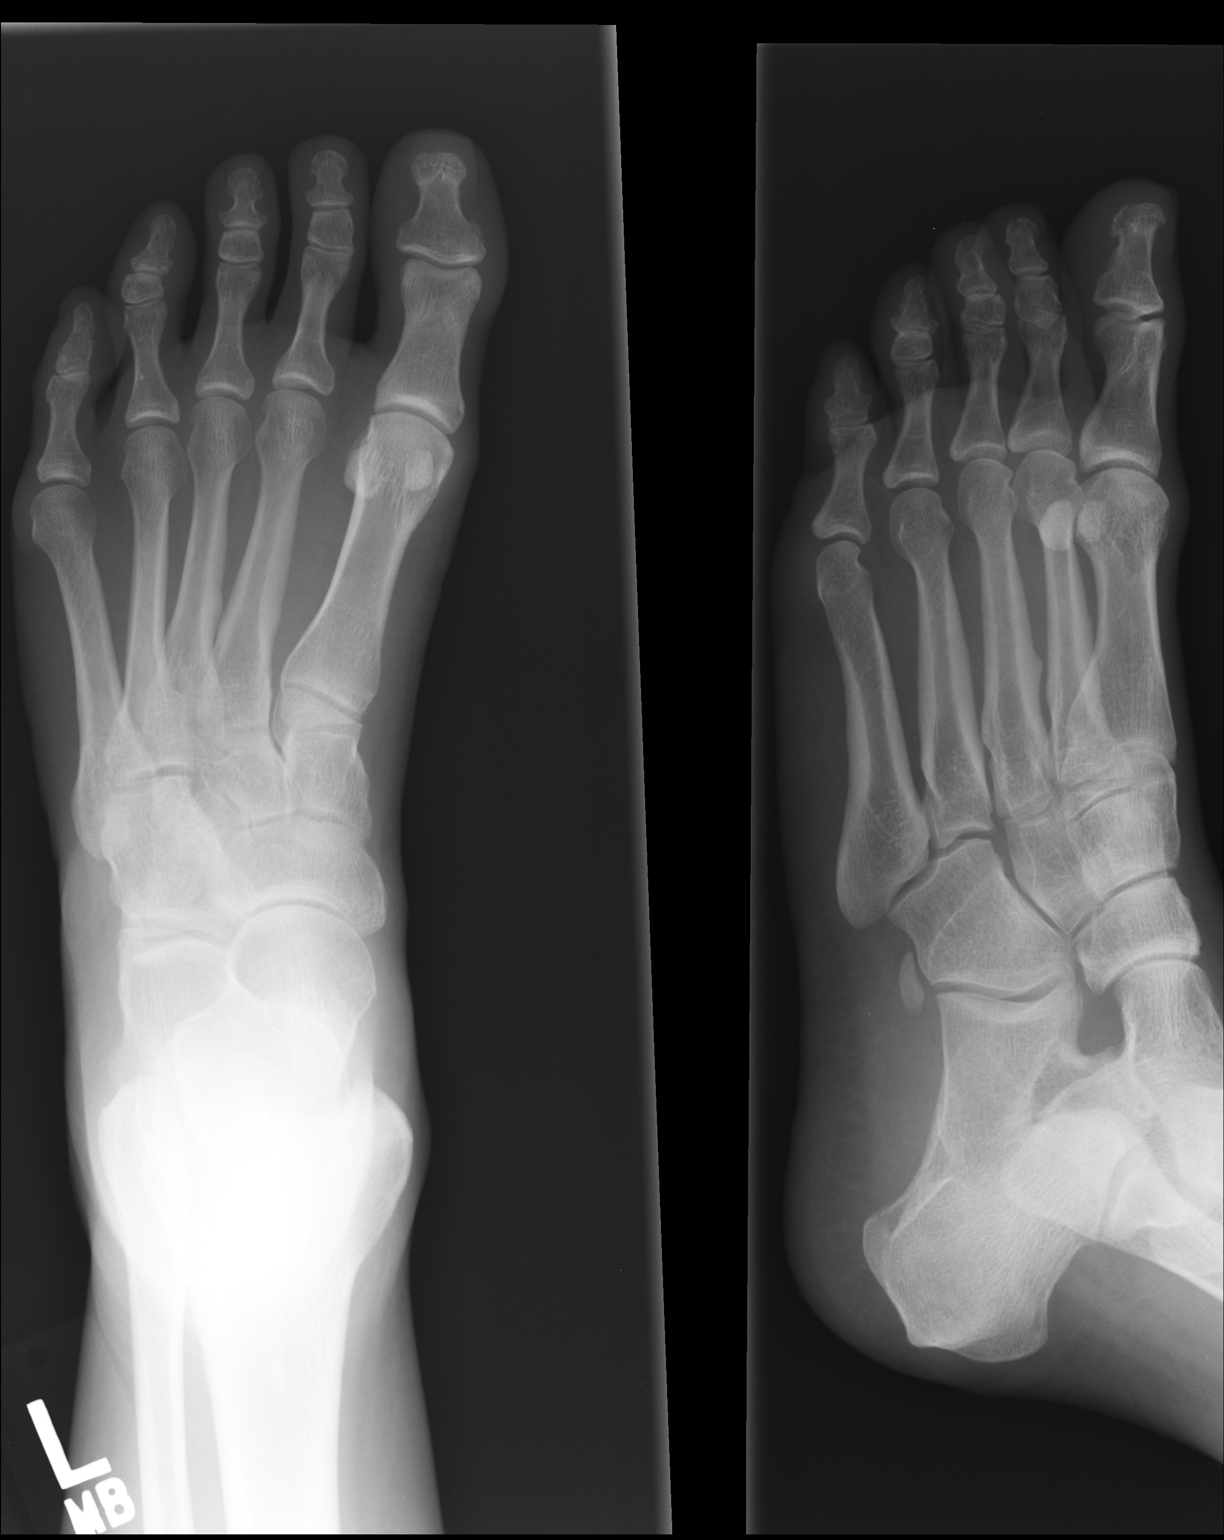

[ap obl int rot]
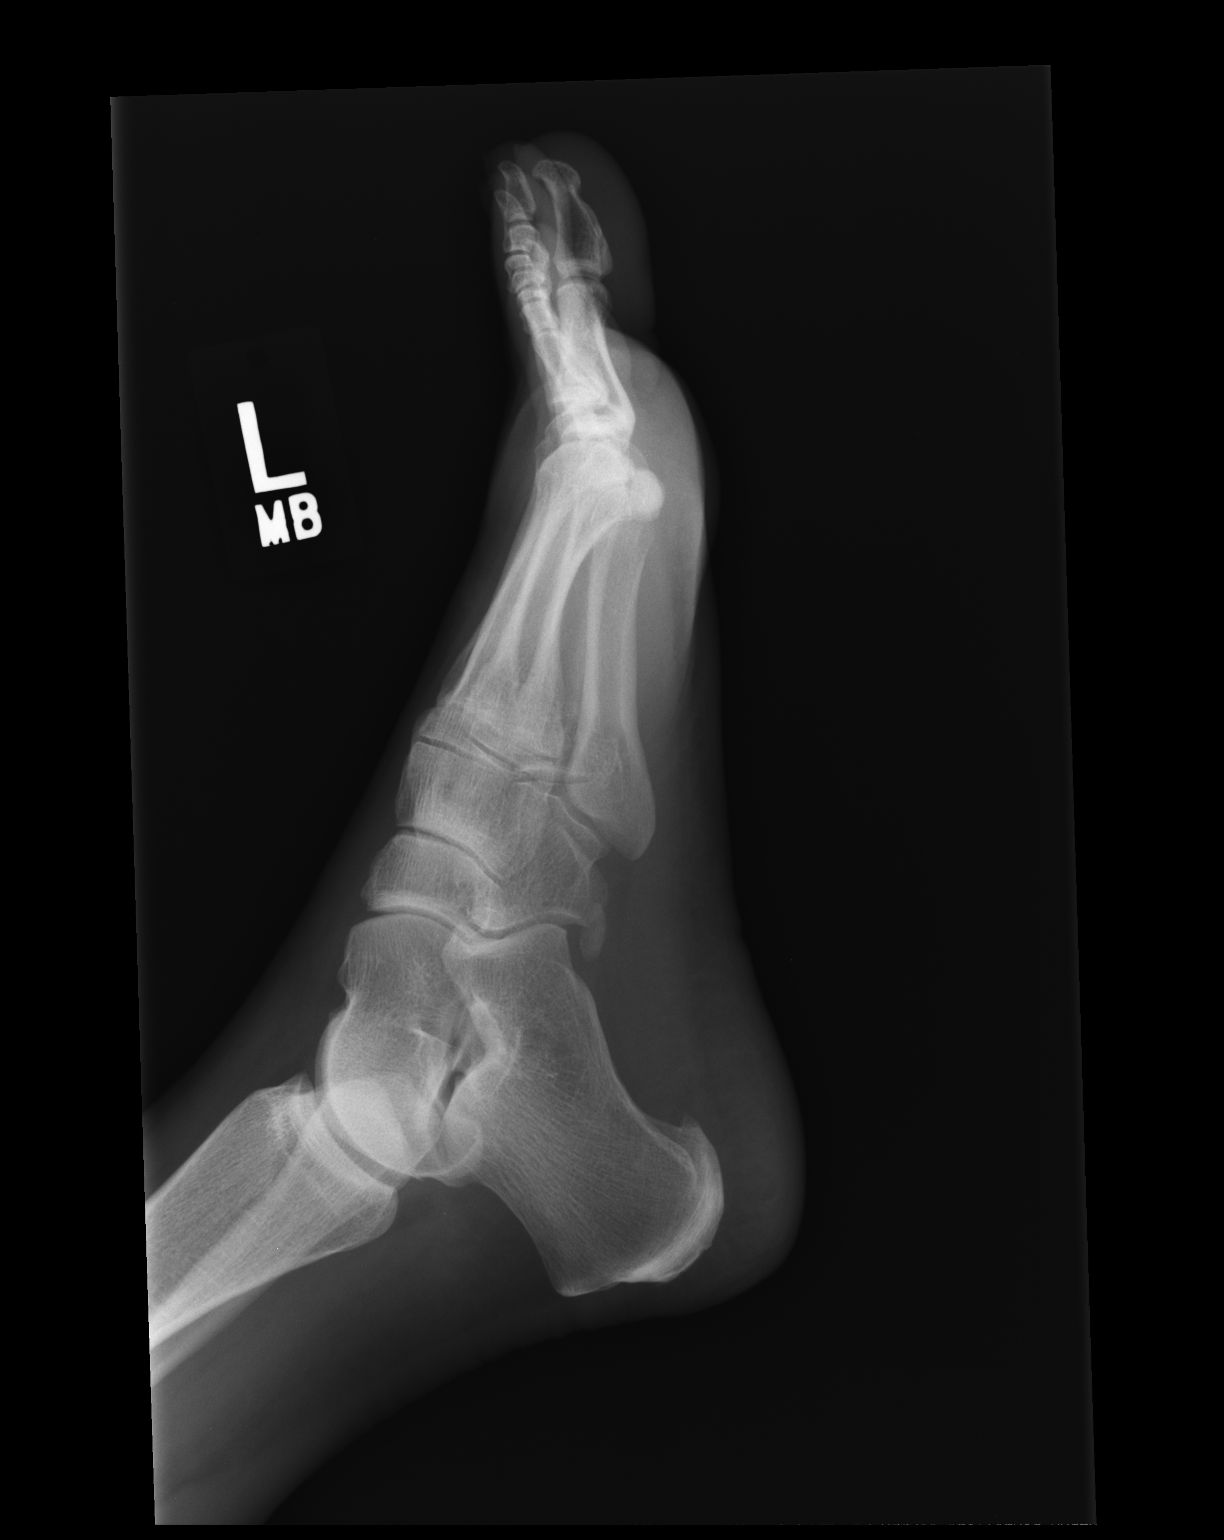

[2 of 2 positions shown; findings below may reference images not displayed]

FINDINGS: No acute osseous or joint abnormality.  No degenerative
changes.
IMPRESSION: Negative.

## 2014-02-08 ENCOUNTER — Encounter: Payer: Self-pay | Admitting: Family Medicine

## 2014-02-08 ENCOUNTER — Ambulatory Visit (INDEPENDENT_AMBULATORY_CARE_PROVIDER_SITE_OTHER): Payer: BC Managed Care – PPO | Admitting: Family Medicine

## 2014-02-08 VITALS — BP 180/102 | HR 91 | Temp 98.0°F | Resp 18 | Ht 60.0 in | Wt 158.0 lb

## 2014-02-08 DIAGNOSIS — L049 Acute lymphadenitis, unspecified: Secondary | ICD-10-CM

## 2014-02-08 MED ORDER — CEPHALEXIN 500 MG PO CAPS: 500.0000 mg | ORAL_CAPSULE | Freq: Three times a day (TID) | ORAL | Status: DC

## 2014-02-08 NOTE — Progress Notes (Signed)
Pre visit review using our clinic review tool, if applicable. No additional management support is needed unless otherwise documented below in the visit note. 

## 2014-02-08 NOTE — Progress Notes (Signed)
OFFICE NOTE  02/08/2014  CC:  Chief Complaint  Patient presents with  . Mass    behind left ear x yesterday     HPI: Patient is a 48 y.o. Caucasian female who is here for swelling behind left ear. Onset yesterday, hurts.  Worse with head movements.  Question of subjective fever today at work. Had URI about a week ago, sx's have cleared up.  No pain inside ear.   Nothing like this has happened to her before.  She applied heat alt/w ice and nothing has affected it. Took ibuprofen today, not much help. Nonsmoker.  Pertinent PMH:  Allergic rhinitis Migraine HA's No hx of HTN/elevated blood pressures  MEDS:  Astelin, zyrtec qd. Imitrex prn (none lately)  PE: Blood pressure 180/102, pulse 91, temperature 98 F (36.7 C), temperature source Temporal, resp. rate 18, height 5' (1.524 m), weight 158 lb (71.668 kg), SpO2 98.00%.  Recheck of bp with manual bp cuff on left arm: 134/86 Scalp: normal ENT: Ears: EACs clear, normal epithelium.  TMs with good light reflex and landmarks bilaterally.  Left retroauricular region with moderate tenderness focally between back of ear and mastoid bone.  No fluctuance or palpable mass.  No signif erythema or warmth. Eyes: no injection, icteris, swelling, or exudate.  EOMI, PERRLA. Nose: no drainage or turbinate edema/swelling.  No injection or focal lesion.  Mouth: lips without lesion/swelling.  Oral mucosa pink and moist.  Dentition intact and without obvious caries or gingival swelling.  Oropharynx without erythema, exudate, or swelling.  NECK: no anterior or posterior LAD   IMPRESSION AND PLAN:  Retroauricular pain most c/w early posterior auricular lymphadenitis. Will treat with cephalexin 500mg  tid x 7d. Continue applying cool compress prn. Signs/symptoms to call or return for were reviewed and pt expressed understanding.  An After Visit Summary was printed and given to the patient.  FOLLOW UP: prn

## 2014-07-22 ENCOUNTER — Other Ambulatory Visit: Payer: Self-pay | Admitting: Family Medicine

## 2014-07-22 NOTE — Telephone Encounter (Signed)
No longer on pt current med list  Last visit 10/16/13 Last refill 07/22/13 #30 0

## 2014-07-23 ENCOUNTER — Telehealth: Payer: Self-pay

## 2014-07-23 NOTE — Telephone Encounter (Signed)
Refill once.   Needs to be seen if she desires to stay on this for a few months.

## 2014-07-23 NOTE — Telephone Encounter (Signed)
Opened by mistake.

## 2014-08-19 ENCOUNTER — Other Ambulatory Visit: Payer: Self-pay | Admitting: Family Medicine

## 2014-08-19 DIAGNOSIS — Z1231 Encounter for screening mammogram for malignant neoplasm of breast: Secondary | ICD-10-CM

## 2014-09-01 ENCOUNTER — Ambulatory Visit (HOSPITAL_COMMUNITY)
Admission: RE | Admit: 2014-09-01 | Discharge: 2014-09-01 | Disposition: A | Discharge status: Home / Self Care | Payer: BC Managed Care – PPO | Source: Ambulatory Visit | Attending: Family Medicine | Admitting: Family Medicine

## 2014-09-01 ENCOUNTER — Other Ambulatory Visit (INDEPENDENT_AMBULATORY_CARE_PROVIDER_SITE_OTHER): Payer: BC Managed Care – PPO

## 2014-09-01 DIAGNOSIS — Z1231 Encounter for screening mammogram for malignant neoplasm of breast: Secondary | ICD-10-CM

## 2014-09-01 DIAGNOSIS — Z0000 Encounter for general adult medical examination without abnormal findings: Secondary | ICD-10-CM

## 2014-09-01 LAB — POCT URINALYSIS DIPSTICK
Bilirubin, UA: NEGATIVE
Glucose, UA: NEGATIVE
KETONES UA: NEGATIVE
LEUKOCYTES UA: NEGATIVE
Nitrite, UA: NEGATIVE
PH UA: 6
PROTEIN UA: NEGATIVE
Spec Grav, UA: <=1.005
UROBILINOGEN UA: 0.2

## 2014-09-01 LAB — CBC WITH DIFFERENTIAL/PLATELET
BASOS PCT: 0.4 % (ref 0.0–3.0)
Basophils Absolute: 0 10*3/uL (ref 0.0–0.1)
EOS ABS: 0.1 10*3/uL (ref 0.0–0.7)
EOS PCT: 1.4 % (ref 0.0–5.0)
HCT: 40 % (ref 36.0–46.0)
HEMOGLOBIN: 13.5 g/dL (ref 12.0–15.0)
Lymphocytes Relative: 20.7 % (ref 12.0–46.0)
Lymphs Abs: 1.7 10*3/uL (ref 0.7–4.0)
MCHC: 33.6 g/dL (ref 30.0–36.0)
MCV: 87.6 fl (ref 78.0–100.0)
MONO ABS: 0.6 10*3/uL (ref 0.1–1.0)
Monocytes Relative: 7.4 % (ref 3.0–12.0)
NEUTROS PCT: 70.1 % (ref 43.0–77.0)
Neutro Abs: 5.8 10*3/uL (ref 1.4–7.7)
Platelets: 364 10*3/uL (ref 150.0–400.0)
RBC: 4.57 Mil/uL (ref 3.87–5.11)
RDW: 12.8 % (ref 11.5–15.5)
WBC: 8.2 10*3/uL (ref 4.0–10.5)

## 2014-09-01 LAB — HEPATIC FUNCTION PANEL
ALBUMIN: 3.8 g/dL (ref 3.5–5.2)
ALT: 22 U/L (ref 0–35)
AST: 23 U/L (ref 0–37)
Alkaline Phosphatase: 54 U/L (ref 39–117)
BILIRUBIN DIRECT: 0.1 mg/dL (ref 0.0–0.3)
TOTAL PROTEIN: 7.5 g/dL (ref 6.0–8.3)
Total Bilirubin: 0.6 mg/dL (ref 0.2–1.2)

## 2014-09-01 LAB — BASIC METABOLIC PANEL
BUN: 13 mg/dL (ref 6–23)
CHLORIDE: 103 meq/L (ref 96–112)
CO2: 27 mEq/L (ref 19–32)
CREATININE: 0.9 mg/dL (ref 0.4–1.2)
Calcium: 9 mg/dL (ref 8.4–10.5)
GFR: 67.06 mL/min (ref >60.00)
Glucose, Bld: 88 mg/dL (ref 70–99)
Potassium: 4.2 mEq/L (ref 3.5–5.1)
Sodium: 137 mEq/L (ref 135–145)

## 2014-09-01 LAB — LIPID PANEL
CHOLESTEROL: 164 mg/dL (ref 0–200)
HDL: 47.3 mg/dL (ref >39.00)
LDL Cholesterol: 91 mg/dL (ref 0–99)
NonHDL: 116.7
Total CHOL/HDL Ratio: 3
Triglycerides: 129 mg/dL (ref 0.0–149.0)
VLDL: 25.8 mg/dL (ref 0.0–40.0)

## 2014-09-01 LAB — TSH: TSH: 1.59 u[IU]/mL (ref 0.35–4.50)

## 2014-09-08 ENCOUNTER — Encounter: Payer: Self-pay | Admitting: Family Medicine

## 2014-09-08 ENCOUNTER — Ambulatory Visit (INDEPENDENT_AMBULATORY_CARE_PROVIDER_SITE_OTHER): Payer: BC Managed Care – PPO | Admitting: Family Medicine

## 2014-09-08 ENCOUNTER — Encounter: Payer: BC Managed Care – PPO | Admitting: Family Medicine

## 2014-09-08 VITALS — BP 122/80 | HR 94 | Temp 98.1°F | Ht 60.0 in | Wt 159.0 lb

## 2014-09-08 DIAGNOSIS — Z23 Encounter for immunization: Secondary | ICD-10-CM

## 2014-09-08 DIAGNOSIS — Z0000 Encounter for general adult medical examination without abnormal findings: Secondary | ICD-10-CM

## 2014-09-08 DIAGNOSIS — R319 Hematuria, unspecified: Secondary | ICD-10-CM

## 2014-09-08 LAB — POCT URINALYSIS DIPSTICK
Bilirubin, UA: NEGATIVE
GLUCOSE UA: NEGATIVE
KETONES UA: NEGATIVE
NITRITE UA: NEGATIVE
Protein, UA: NEGATIVE
Spec Grav, UA: 1.015
Urobilinogen, UA: 0.2
pH, UA: 5.5

## 2014-09-08 MED ORDER — PHENTERMINE HCL 37.5 MG PO CAPS: 37.5000 mg | ORAL_CAPSULE | ORAL | Status: DC

## 2014-09-08 NOTE — Progress Notes (Signed)
Pre visit review using our clinic review tool, if applicable. No additional management support is needed unless otherwise documented below in the visit note. 

## 2014-09-08 NOTE — Progress Notes (Signed)
   Subjective:    Patient ID: Ana Rivera, female    DOB: Jul 15, 1966, 48 y.o.   MRN: [id number]  HPI Patient seen for complete physical. She saw a gynecologist recently and has recently had mammogram earlier this month which was normal. She's had previous colonoscopy in 2010. Last tetanus unknown. No flu vaccine yet. Generally feels well. She has some nonspecific fatigue. She's had some steady weight gain in recent years. She walks for exercise. Had recent basal cell carcinoma removed right upper lip region  Past Medical History  Diagnosis Date  . Allergy    Past Surgical History  Procedure Laterality Date  . Abdominal hysterectomy  2003    reports that she has never smoked. She does not have any smokeless tobacco history on file. She reports that she drinks alcohol. Her drug history is not on file. family history includes Cancer (age of onset: 71) in her father; Cancer (age of onset: 8) in her mother; Diabetes in her maternal grandmother; Heart disease (age of onset: 63) in her father. Allergies  Allergen Reactions  . Hydrocodone     Hycodan cough syrup caused vomiting and headache      Review of Systems  Constitutional: Positive for fatigue. Negative for fever, activity change, appetite change and unexpected weight change.  HENT: Negative for ear pain, hearing loss, sore throat and trouble swallowing.   Eyes: Negative for visual disturbance.  Respiratory: Negative for cough and shortness of breath.   Cardiovascular: Negative for chest pain and palpitations.  Gastrointestinal: Negative for abdominal pain, diarrhea, constipation and blood in stool.  Genitourinary: Negative for dysuria and hematuria.  Musculoskeletal: Negative for arthralgias, back pain and myalgias.  Skin: Negative for rash.  Neurological: Negative for dizziness, syncope and headaches.  Hematological: Negative for adenopathy.  Psychiatric/Behavioral: Negative for confusion and dysphoric mood.         Objective:   Physical Exam  Constitutional: She is oriented to person, place, and time. She appears well-developed and well-nourished.  HENT:  Head: Normocephalic and atraumatic.  Eyes: EOM are normal. Pupils are equal, round, and reactive to light.  Neck: Normal range of motion. Neck supple. No thyromegaly present.  Cardiovascular: Normal rate, regular rhythm and normal heart sounds.   No murmur heard. Pulmonary/Chest: Breath sounds normal. No respiratory distress. She has no wheezes. She has no rales.  Abdominal: Soft. Bowel sounds are normal. She exhibits no distension and no mass. There is no tenderness. There is no rebound and no guarding.  Genitourinary:  Per GYN  Musculoskeletal: Normal range of motion. She exhibits no edema.  Lymphadenopathy:    She has no cervical adenopathy.  Neurological: She is alert and oriented to person, place, and time. She displays normal reflexes. No cranial nerve deficit.  Skin: No rash noted.  Psychiatric: She has a normal mood and affect. Her behavior is normal. Judgment and thought content normal.          Assessment & Plan:  Complete physical. Tetanus booster given. Flu vaccine given. Labs reviewed. No major concerns. Weight loss strategies discussed. She will continue with regular GYN followup. She's had previous hysterectomy for benign disease.

## 2014-09-09 LAB — URINALYSIS, MICROSCOPIC ONLY

## 2014-10-25 ENCOUNTER — Other Ambulatory Visit: Payer: Self-pay | Admitting: Family Medicine

## 2014-10-25 NOTE — Telephone Encounter (Signed)
Refill OK

## 2014-10-25 NOTE — Telephone Encounter (Signed)
Last visit 09/08/14 Last refill 09/08/14 #30 0 refill

## 2015-02-05 ENCOUNTER — Other Ambulatory Visit: Payer: Self-pay | Admitting: Family Medicine

## 2015-04-04 ENCOUNTER — Encounter: Payer: Self-pay | Admitting: Family Medicine

## 2015-05-30 ENCOUNTER — Other Ambulatory Visit: Payer: Self-pay | Admitting: Family Medicine

## 2015-05-30 NOTE — Telephone Encounter (Signed)
Last visit 09/08/14 Last refill 10/26/14 #30 0 refill

## 2015-05-31 NOTE — Telephone Encounter (Signed)
Refill once 

## 2015-05-31 NOTE — Telephone Encounter (Signed)
Rx faxed to pharmacy  

## 2015-07-08 ENCOUNTER — Other Ambulatory Visit: Payer: Self-pay | Admitting: Family Medicine

## 2015-07-08 NOTE — Telephone Encounter (Signed)
Last visit 09/08/14 Last refill 05/31/15 #30  0 refill

## 2015-07-09 NOTE — Telephone Encounter (Signed)
Needs office follow up .Marland Kitchen   May refill once.

## 2015-09-22 ENCOUNTER — Ambulatory Visit (INDEPENDENT_AMBULATORY_CARE_PROVIDER_SITE_OTHER)
Admission: RE | Admit: 2015-09-22 | Discharge: 2015-09-22 | Disposition: A | Discharge status: Home / Self Care | Payer: BLUE CROSS/BLUE SHIELD | Source: Ambulatory Visit | Attending: Family Medicine | Admitting: Family Medicine

## 2015-09-22 ENCOUNTER — Telehealth: Payer: Self-pay | Admitting: Family Medicine

## 2015-09-22 ENCOUNTER — Ambulatory Visit (INDEPENDENT_AMBULATORY_CARE_PROVIDER_SITE_OTHER): Payer: BLUE CROSS/BLUE SHIELD | Admitting: Family Medicine

## 2015-09-22 ENCOUNTER — Encounter: Payer: Self-pay | Admitting: Family Medicine

## 2015-09-22 VITALS — BP 140/90 | HR 102 | Temp 98.4°F | Ht 60.0 in | Wt 160.2 lb

## 2015-09-22 DIAGNOSIS — J988 Other specified respiratory disorders: Secondary | ICD-10-CM

## 2015-09-22 MED ORDER — BENZONATATE 100 MG PO CAPS: 100.0000 mg | ORAL_CAPSULE | Freq: Two times a day (BID) | ORAL | Status: DC

## 2015-09-22 MED ORDER — DOXYCYCLINE HYCLATE 100 MG PO TABS: 100.0000 mg | ORAL_TABLET | Freq: Two times a day (BID) | ORAL | Status: DC

## 2015-09-22 NOTE — Telephone Encounter (Signed)
Patient called to see if her xray results were back yet. If so, she would like a call please.

## 2015-09-22 NOTE — Progress Notes (Signed)
HPI:  URI -started: 2-3 days ago -symptoms:nasal congestion, sore throat, cough, diarrhea a few days ago - now resolved -denies:fever, SOB, NVD, tooth pain -has tried: nothing -sick contacts/travel/risks: denies flu exposure, tick exposure or or Ebola risks -Hx of: allergies, allergic to codeine ROS: See pertinent positives and negatives per HPI.  Past Medical History  Diagnosis Date  . Allergy     Past Surgical History  Procedure Laterality Date  . Abdominal hysterectomy  2003    Family History  Problem Relation Age of Onset  . Cancer Mother 78    melanoma  . Cancer Father 71    bladder and prostate  . Heart disease Father 73    CABG  . Diabetes Maternal Grandmother     Social History   Social History  . Marital Status: Married    Spouse Name: N/A  . Number of Children: N/A  . Years of Education: N/A   Social History Main Topics  . Smoking status: Never Smoker   . Smokeless tobacco: None  . Alcohol Use: Yes     Comment: social  . Drug Use: None  . Sexual Activity: Yes     Comment: married   Other Topics Concern  . None   Social History Narrative     Current outpatient prescriptions:  .  azelastine (ASTELIN) 0.1 % nasal spray, USE TWO SPRAYS IN EACH NOSTRIL TWICE DAILY , Disp: 30 mL, Rfl: 11 .  cetirizine (ZYRTEC) 10 MG tablet, Take 10 mg by mouth daily.  , Disp: , Rfl:  .  phentermine (ADIPEX-P) 37.5 MG tablet, TABLET BY MOUTH EVERY DAY IN THE MORNING, Disp: 30 tablet, Rfl: 0 .  phentermine 37.5 MG capsule, TAKE ONE CAPSULE BY MOUTH EVERY MORNING , Disp: 30 capsule, Rfl: 0 .  SUMAtriptan (IMITREX) 100 MG tablet, Take 1 tablet (100 mg total) by mouth every 2 (two) hours as needed for migraine or headache. May repeat in 2 hours if headache persists or recurs., Disp: 10 tablet, Rfl: 3 .  benzonatate (TESSALON PERLES) 100 MG capsule, Take 1 capsule (100 mg total) by mouth 2 (two) times daily., Disp: 20 capsule, Rfl: 0  EXAM:  Filed Vitals:   09/22/15 1302  BP: 140/90  Pulse: 102  Temp: 98.4 F (36.9 C)    Body mass index is 31.29 kg/(m^2).  GENERAL: vitals reviewed and listed above, alert, oriented, appears well hydrated and in no acute distress  HEENT: atraumatic, conjunttiva clear, no obvious abnormalities on inspection of external nose and ears, normal appearance of ear canals and TMs, clear nasal congestion, mild post oropharyngeal erythema with PND, no tonsillar edema or exudate, no sinus TTP  NECK: no obvious masses on inspection  LUNGS: clear to auscultation bilaterally, no wheezes, rales or rhonchi, good air movement except in R base  CV: HRRR, no peripheral edema  MS: moves all extremities without noticeable abnormality  PSYCH: pleasant and cooperative, no obvious depression or anxiety  ASSESSMENT AND PLAN:  Discussed the following assessment and plan:  Respiratory infection - Plan: DG Chest 2 View  -given HPI and exam findings today, a serious infection or illness is unlikely. We discussed potential etiologies, with VURI being most likely, and advised supportive care and monitoring. We discussed treatment side effects, likely course, antibiotic misuse, transmission, and signs of developing a serious illness. -check CXR to exclude pneumonia due to lung findings -of course, we advised to return or notify a doctor immediately if symptoms worsen or persist or new concerns  arise.    Patient Instructions  BEFORE YOU LEAVE: -see if she want flu shot -xray sheet  Go get the xray - we will call you if this shows infection in the lungs   INSTRUCTIONS FOR UPPER RESPIRATORY INFECTION:  -plenty of rest and fluids  -nasal saline wash 2-3 times daily (use prepackaged nasal saline or bottled/distilled water if making your own)   -can use AFRIN nasal spray for drainage and nasal congestion - but do NOT use longer then 3-4 days  -can use tylenol (in no history of liver disease) or ibuprofen (if no history of  kidney disease, bowel bleeding or significant heart disease) as directed for aches and sorethroat  -in the winter time, using a humidifier at night is helpful (please follow cleaning instructions)  -if you are taking a cough medication - use only as directed, may also try a teaspoon of honey to coat the throat and throat lozenges. If given a cough medication with codeine or hydrocodone or other narcotic please be advised that this contains a strong and  potentially addicting medication. Please follow instructions carefully, take as little as possible and only use AS NEEDED for severe cough. Discuss potential side effects with your pharmacy. Please do not drive or operate machinery while taking these types of medications. Please do not take other sedating medications, drugs or alcohol while taking this medication without discussing with your doctor.  -for sore throat, salt water gargles can help  -follow up if you have fevers, facial pain, tooth pain, difficulty breathing or are worsening or symptoms persist longer then expected  Upper Respiratory Infection, Adult An upper respiratory infection (URI) is also known as the common cold. It is often caused by a type of germ (virus). Colds are easily spread (contagious). You can pass it to others by kissing, coughing, sneezing, or drinking out of the same glass. Usually, you get better in 1 to 3  weeks.  However, the cough can last for even longer. HOME CARE   Only take medicine as told by your doctor. Follow instructions provided above.  Drink enough water and fluids to keep your pee (urine) clear or pale yellow.  Get plenty of rest.  Return to work when your temperature is < 100 for 24 hours or as told by your doctor. You may use a face mask and wash your hands to stop your cold from spreading. GET HELP RIGHT AWAY IF:   After the first few days, you feel you are getting worse.  You have questions about your medicine.  You have chills,  shortness of breath, or red spit (mucus).  You have pain in the face for more then 1-2 days, especially when you bend forward.  You have a fever, puffy (swollen) neck, pain when you swallow, or white spots in the back of your throat.  You have a bad headache, ear pain, sinus pain, or chest pain.  You have a high-pitched whistling sound when you breathe in and out (wheezing).  You cough up blood.  You have sore muscles or a stiff neck. MAKE SURE YOU:   Understand these instructions.  Will watch your condition.  Will get help right away if you are not doing well or get worse. Document Released: 05/14/2008 Document Revised: 02/18/2012 Document Reviewed: 03/03/2014 [facility] Patient Information 2015 [place], [place]. This information is not intended to replace advice given to you by your health care provider. Make sure you discuss any questions you have with your health care provider.  Colin Benton R.

## 2015-09-22 NOTE — Progress Notes (Signed)
Pre visit review using our clinic review tool, if applicable. No additional management support is needed unless otherwise documented below in the visit note. 

## 2015-09-22 NOTE — Patient Instructions (Signed)
BEFORE YOU LEAVE: -see if she want flu shot -xray sheet  Go get the xray - we will call you if this shows infection in the lungs   INSTRUCTIONS FOR UPPER RESPIRATORY INFECTION:  -plenty of rest and fluids  -nasal saline wash 2-3 times daily (use prepackaged nasal saline or bottled/distilled water if making your own)   -can use AFRIN nasal spray for drainage and nasal congestion - but do NOT use longer then 3-4 days  -can use tylenol (in no history of liver disease) or ibuprofen (if no history of kidney disease, bowel bleeding or significant heart disease) as directed for aches and sorethroat  -in the winter time, using a humidifier at night is helpful (please follow cleaning instructions)  -if you are taking a cough medication - use only as directed, may also try a teaspoon of honey to coat the throat and throat lozenges. If given a cough medication with codeine or hydrocodone or other narcotic please be advised that this contains a strong and  potentially addicting medication. Please follow instructions carefully, take as little as possible and only use AS NEEDED for severe cough. Discuss potential side effects with your pharmacy. Please do not drive or operate machinery while taking these types of medications. Please do not take other sedating medications, drugs or alcohol while taking this medication without discussing with your doctor.  -for sore throat, salt water gargles can help  -follow up if you have fevers, facial pain, tooth pain, difficulty breathing or are worsening or symptoms persist longer then expected  Upper Respiratory Infection, Adult An upper respiratory infection (URI) is also known as the common cold. It is often caused by a type of germ (virus). Colds are easily spread (contagious). You can pass it to others by kissing, coughing, sneezing, or drinking out of the same glass. Usually, you get better in 1 to 3  weeks.  However, the cough can last for even longer. HOME  CARE   Only take medicine as told by your doctor. Follow instructions provided above.  Drink enough water and fluids to keep your pee (urine) clear or pale yellow.  Get plenty of rest.  Return to work when your temperature is < 100 for 24 hours or as told by your doctor. You may use a face mask and wash your hands to stop your cold from spreading. GET HELP RIGHT AWAY IF:   After the first few days, you feel you are getting worse.  You have questions about your medicine.  You have chills, shortness of breath, or red spit (mucus).  You have pain in the face for more then 1-2 days, especially when you bend forward.  You have a fever, puffy (swollen) neck, pain when you swallow, or white spots in the back of your throat.  You have a bad headache, ear pain, sinus pain, or chest pain.  You have a high-pitched whistling sound when you breathe in and out (wheezing).  You cough up blood.  You have sore muscles or a stiff neck. MAKE SURE YOU:   Understand these instructions.  Will watch your condition.  Will get help right away if you are not doing well or get worse. Document Released: 05/14/2008 Document Revised: 02/18/2012 Document Reviewed: 03/03/2014 [facility] Patient Information 2015 [place], [place]. This information is not intended to replace advice given to you by your health care provider. Make sure you discuss any questions you have with your health care provider.

## 2015-09-22 NOTE — Telephone Encounter (Signed)
Called pt. Will start doxycycline. Risks discussed. ED/Return precautions if not improving. Advised to schedule follow up in 4 weeks regardless to ensure resolution.

## 2015-09-24 ENCOUNTER — Other Ambulatory Visit: Payer: Self-pay | Admitting: Family Medicine

## 2015-10-07 ENCOUNTER — Telehealth: Payer: Self-pay | Admitting: Family Medicine

## 2015-10-07 NOTE — Telephone Encounter (Signed)
i recommend follow up sooner if she has any shortness of breath or fever.

## 2015-10-07 NOTE — Telephone Encounter (Signed)
Pt was seen by dr Maudie Mercury and dx with walking PNE on 110/13. Finished her antibiotics and still feels bad. Today her chest is hurting, no fever, chills or stiffness. Is having trouble catching her breath and is she takes a deep breath she only manages to cough. Wants to know if another round of antibiotics is in order or if she should make an appointment sooner than her follow up that is scheduled for 10/21/15.

## 2015-10-08 ENCOUNTER — Ambulatory Visit (INDEPENDENT_AMBULATORY_CARE_PROVIDER_SITE_OTHER): Payer: BLUE CROSS/BLUE SHIELD | Admitting: Family Medicine

## 2015-10-08 ENCOUNTER — Encounter: Payer: Self-pay | Admitting: Family Medicine

## 2015-10-08 VITALS — BP 132/88 | HR 93 | Temp 98.1°F | Ht 60.0 in | Wt 158.5 lb

## 2015-10-08 DIAGNOSIS — R05 Cough: Secondary | ICD-10-CM

## 2015-10-08 DIAGNOSIS — R059 Cough, unspecified: Secondary | ICD-10-CM

## 2015-10-08 MED ORDER — BENZONATATE 200 MG PO CAPS: 200.0000 mg | ORAL_CAPSULE | Freq: Three times a day (TID) | ORAL | Status: DC | PRN

## 2015-10-08 NOTE — Progress Notes (Signed)
   Subjective:    Patient ID: Ana Rivera, female    DOB: Dec 27, 1965, 49 y.o.   MRN: [id number]  HPI   Patient is nonsmoker seen with persistent coughing. She was seen recently through our clinic and treated with doxycycline Chest x-ray had shown mild diffuse interstitial prominence suggesting possible pneumonitis type process. She denies any fever. No pleuritic pain. No hemoptysis. No night sweats. No wheezes. She was taking Tessalon which seem to be helping her cough but she ran out. She completed the course of doxycycline.  Past Medical History  Diagnosis Date  . Allergy    Past Surgical History  Procedure Laterality Date  . Abdominal hysterectomy  2003    reports that she has never smoked. She does not have any smokeless tobacco history on file. She reports that she drinks alcohol. Her drug history is not on file. family history includes Cancer (age of onset: 68) in her father; Cancer (age of onset: 80) in her mother; Diabetes in her maternal grandmother; Heart disease (age of onset: 29) in her father. Allergies  Allergen Reactions  . Hydrocodone     Hycodan cough syrup caused vomiting and headache     Review of Systems  Constitutional: Negative for fever, chills, appetite change and unexpected weight change.  Respiratory: Positive for cough. Negative for shortness of breath and wheezing.   Cardiovascular: Negative for chest pain.       Objective:   Physical Exam  Constitutional: She appears well-developed and well-nourished.  HENT:  Right Ear: External ear normal.  Left Ear: External ear normal.  Mouth/Throat: Oropharynx is clear and moist.  Neck: Neck supple.  Cardiovascular: Normal rate and regular rhythm.   Pulmonary/Chest: Effort normal and breath sounds normal. No respiratory distress. She has no wheezes. She has no rales.  Lymphadenopathy:    She has no cervical adenopathy.          Assessment & Plan:  Cough. Suspect postviral process. She has  nonfocal exam. Pulse oximetry 98%. Refill Tessalon for prn use. Follow-up promptly for any fever or persistent cough. If cough not resolving over the last couple of weeks would consider follow-up chest x-ray

## 2015-10-08 NOTE — Patient Instructions (Addendum)
Cough, Adult Coughing is a reflex that clears your throat and your airways. Coughing helps to heal and protect your lungs. It is normal to cough occasionally, but a cough that happens with other symptoms or lasts a long time may be a sign of a condition that needs treatment. A cough may last only 2-3 weeks (acute), or it may last longer than 8 weeks (chronic). CAUSES Coughing is commonly caused by:  Breathing in substances that irritate your lungs.  A viral or bacterial respiratory infection.  Allergies.  Asthma.  Postnasal drip.  Smoking.  Acid backing up from the stomach into the esophagus (gastroesophageal reflux).  Certain medicines.  Chronic lung problems, including COPD (or rarely, lung cancer).  Other medical conditions such as heart failure. HOME CARE INSTRUCTIONS  Pay attention to any changes in your symptoms. Take these actions to help with your discomfort:  Take medicines only as told by your health care provider.  If you were prescribed an antibiotic medicine, take it as told by your health care provider. Do not stop taking the antibiotic even if you start to feel better.  Talk with your health care provider before you take a cough suppressant medicine.  Drink enough fluid to keep your urine clear or pale yellow.  If the air is dry, use a cold steam vaporizer or humidifier in your bedroom or your home to help loosen secretions.  Avoid anything that causes you to cough at work or at home.  If your cough is worse at night, try sleeping in a semi-upright position.  Avoid cigarette smoke. If you smoke, quit smoking. If you need help quitting, ask your health care provider.  Avoid caffeine.  Avoid alcohol.  Rest as needed. SEEK MEDICAL CARE IF:   You have new symptoms.  You cough up pus.  Your cough does not get better after 2-3 weeks, or your cough gets worse.  You cannot control your cough with suppressant medicines and you are losing sleep.  You  develop pain that is getting worse or pain that is not controlled with pain medicines.  You have a fever.  You have unexplained weight loss.  You have night sweats. SEEK IMMEDIATE MEDICAL CARE IF:  You cough up blood.  You have difficulty breathing.  Your heartbeat is very fast.   This information is not intended to replace advice given to you by your health care provider. Make sure you discuss any questions you have with your health care provider.   Document Released: 05/25/2011 Document Revised: 08/17/2015 Document Reviewed: 02/02/2015 Elsevier Interactive Patient Education [employer].  Follow-up promptly for any fever. Touch base in 2 weeks if cough not resolving. Would consider follow-up chest x-ray then if not further improved

## 2015-10-08 NOTE — Progress Notes (Signed)
Pre visit review using our clinic review tool, if applicable. No additional management support is needed unless otherwise documented below in the visit note. 

## 2015-10-10 NOTE — Telephone Encounter (Signed)
FYI

## 2015-10-10 NOTE — Telephone Encounter (Signed)
Patient seen on Saturday clinic

## 2015-10-21 ENCOUNTER — Ambulatory Visit: Payer: BLUE CROSS/BLUE SHIELD | Admitting: Family Medicine

## 2015-11-10 LAB — HM MAMMOGRAPHY: HM MAMMO: NORMAL (ref 0–4)

## 2016-03-28 ENCOUNTER — Other Ambulatory Visit: Payer: Self-pay | Admitting: Family Medicine

## 2016-07-05 ENCOUNTER — Other Ambulatory Visit: Payer: Self-pay | Admitting: Family Medicine

## 2016-08-20 ENCOUNTER — Other Ambulatory Visit (INDEPENDENT_AMBULATORY_CARE_PROVIDER_SITE_OTHER): Payer: BLUE CROSS/BLUE SHIELD

## 2016-08-20 DIAGNOSIS — Z0000 Encounter for general adult medical examination without abnormal findings: Secondary | ICD-10-CM

## 2016-08-20 LAB — CBC WITH DIFFERENTIAL/PLATELET
BASOS ABS: 0.1 10*3/uL (ref 0.0–0.1)
Basophils Relative: 0.6 % (ref 0.0–3.0)
Eosinophils Absolute: 0.2 10*3/uL (ref 0.0–0.7)
Eosinophils Relative: 2.3 % (ref 0.0–5.0)
HEMATOCRIT: 39.8 % (ref 36.0–46.0)
HEMOGLOBIN: 13.6 g/dL (ref 12.0–15.0)
LYMPHS PCT: 20.6 % (ref 12.0–46.0)
Lymphs Abs: 2 10*3/uL (ref 0.7–4.0)
MCHC: 34.1 g/dL (ref 30.0–36.0)
MCV: 86.8 fl (ref 78.0–100.0)
MONOS PCT: 6.8 % (ref 3.0–12.0)
Monocytes Absolute: 0.7 10*3/uL (ref 0.1–1.0)
NEUTROS ABS: 6.9 10*3/uL (ref 1.4–7.7)
Neutrophils Relative %: 69.7 % (ref 43.0–77.0)
PLATELETS: 360 10*3/uL (ref 150.0–400.0)
RBC: 4.59 Mil/uL (ref 3.87–5.11)
RDW: 12.7 % (ref 11.5–15.5)
WBC: 9.9 10*3/uL (ref 4.0–10.5)

## 2016-08-20 LAB — BASIC METABOLIC PANEL
BUN: 13 mg/dL (ref 6–23)
CALCIUM: 9.3 mg/dL (ref 8.4–10.5)
CO2: 26 meq/L (ref 19–32)
Chloride: 104 mEq/L (ref 96–112)
Creatinine, Ser: 0.8 mg/dL (ref 0.40–1.20)
GFR: 80.14 mL/min (ref >60.00)
Glucose, Bld: 94 mg/dL (ref 70–99)
Potassium: 3.8 mEq/L (ref 3.5–5.1)
SODIUM: 137 meq/L (ref 135–145)

## 2016-08-20 LAB — HEPATIC FUNCTION PANEL
ALBUMIN: 4.1 g/dL (ref 3.5–5.2)
ALT: 16 U/L (ref 0–35)
AST: 19 U/L (ref 0–37)
Alkaline Phosphatase: 48 U/L (ref 39–117)
BILIRUBIN TOTAL: 0.4 mg/dL (ref 0.2–1.2)
Bilirubin, Direct: 0.1 mg/dL (ref 0.0–0.3)
TOTAL PROTEIN: 7.4 g/dL (ref 6.0–8.3)

## 2016-08-20 LAB — LIPID PANEL
CHOLESTEROL: 167 mg/dL (ref 0–200)
HDL: 52.9 mg/dL (ref >39.00)
LDL CALC: 91 mg/dL (ref 0–99)
NonHDL: 114.1
TRIGLYCERIDES: 114 mg/dL (ref 0.0–149.0)
Total CHOL/HDL Ratio: 3
VLDL: 22.8 mg/dL (ref 0.0–40.0)

## 2016-08-20 LAB — TSH: TSH: 1.27 u[IU]/mL (ref 0.35–4.50)

## 2016-08-29 ENCOUNTER — Ambulatory Visit (INDEPENDENT_AMBULATORY_CARE_PROVIDER_SITE_OTHER): Payer: BLUE CROSS/BLUE SHIELD | Admitting: Family Medicine

## 2016-08-29 VITALS — BP 128/80 | HR 103 | Temp 98.3°F | Ht 60.0 in | Wt 161.0 lb

## 2016-08-29 DIAGNOSIS — Z23 Encounter for immunization: Secondary | ICD-10-CM

## 2016-08-29 DIAGNOSIS — Z0000 Encounter for general adult medical examination without abnormal findings: Secondary | ICD-10-CM

## 2016-08-29 DIAGNOSIS — R221 Localized swelling, mass and lump, neck: Secondary | ICD-10-CM | POA: Diagnosis not present

## 2016-08-29 MED ORDER — AZELASTINE HCL 0.1 % NA SOLN: 2.0000 | Freq: Two times a day (BID) | NASAL | 11 refills | Status: DC

## 2016-08-29 NOTE — Progress Notes (Signed)
Subjective:     Patient ID: Ana Rivera, female   DOB: 12/14/1965, 50 y.o.   MRN: [id number]  HPI Patient seen for physical. She sees gynecologist and also sees dermatologist yearly for screening. Her mother had melanoma. She's had previous hysterectomy for benign disease. She has migraine headaches and has infrequent migraines treated with Imitrex. Needs flu vaccine. Other immunizations up-to-date. Colonoscopy up-to-date.  Right neck fullness/mass noted on exam today.  No recent hoarseness or dysphagia.  No history of neck radiation.  She had not noted any neck fullness.  Past Medical History:  Diagnosis Date  . Allergy    Past Surgical History:  Procedure Laterality Date  . ABDOMINAL HYSTERECTOMY  2003    reports that she has never smoked. She does not have any smokeless tobacco history on file. She reports that she drinks alcohol. Her drug history is not on file. family history includes Cancer (age of onset: 72) in her father; Cancer (age of onset: 50) in her mother; Diabetes in her maternal grandmother; Heart disease (age of onset: 58) in her father. Allergies  Allergen Reactions  . Hydrocodone     Hycodan cough syrup caused vomiting and headache     Review of Systems  Constitutional: Negative for activity change, appetite change, fatigue, fever and unexpected weight change.  HENT: Negative for ear pain, hearing loss, sore throat and trouble swallowing.   Eyes: Negative for visual disturbance.  Respiratory: Negative for cough and shortness of breath.   Cardiovascular: Negative for chest pain and palpitations.  Gastrointestinal: Negative for abdominal pain, blood in stool, constipation and diarrhea.  Genitourinary: Negative for dysuria and hematuria.  Musculoskeletal: Negative for arthralgias, back pain and myalgias.  Skin: Negative for rash.  Neurological: Negative for dizziness, syncope and headaches.  Hematological: Negative for adenopathy.  Psychiatric/Behavioral:  Negative for confusion and dysphoric mood.       Objective:   Physical Exam  Constitutional: She is oriented to person, place, and time. She appears well-developed and well-nourished.  HENT:  Head: Normocephalic and atraumatic.  Eyes: EOM are normal. Pupils are equal, round, and reactive to light.  Neck: Normal range of motion. Neck supple.  Patient has some fullness right side of thyroid, nontender.   Cardiovascular: Normal rate, regular rhythm and normal heart sounds.   No murmur heard. Pulmonary/Chest: Breath sounds normal. No respiratory distress. She has no wheezes. She has no rales.  Abdominal: Soft. Bowel sounds are normal. She exhibits no distension and no mass. There is no tenderness. There is no rebound and no guarding.  Genitourinary:  Genitourinary Comments: Per GYN  Musculoskeletal: Normal range of motion. She exhibits no edema.  Lymphadenopathy:    She has no cervical adenopathy.  Neurological: She is alert and oriented to person, place, and time. She displays normal reflexes. No cranial nerve deficit.  Skin: No rash noted.  Psychiatric: She has a normal mood and affect. Her behavior is normal. Judgment and thought content normal.       Assessment:     Physical exam. Labs reviewed with patient with no major concerns. She has noted asymmetry of her thyroid with prominence of the right lobe and question of a couple distinct masses in that lobe    Plan:     -Set up neck ultrasound to further assess -Flu vaccine given -she will continue with GYN follow up and getting mammograms through them.  Eulas Post MD [facility] at [facility]

## 2016-08-29 NOTE — Patient Instructions (Signed)
We wiil set up neck ultrasound to evaluate right neck fullness.

## 2016-08-29 NOTE — Progress Notes (Signed)
Pre visit review using our clinic review tool, if applicable. No additional management support is needed unless otherwise documented below in the visit note. 

## 2016-09-03 ENCOUNTER — Ambulatory Visit
Admission: RE | Admit: 2016-09-03 | Discharge: 2016-09-03 | Disposition: A | Discharge status: Home / Self Care | Payer: BLUE CROSS/BLUE SHIELD | Source: Ambulatory Visit | Attending: Family Medicine | Admitting: Family Medicine

## 2016-09-03 DIAGNOSIS — R221 Localized swelling, mass and lump, neck: Secondary | ICD-10-CM

## 2016-09-03 DIAGNOSIS — E042 Nontoxic multinodular goiter: Secondary | ICD-10-CM | POA: Diagnosis not present

## 2016-09-04 ENCOUNTER — Other Ambulatory Visit: Payer: Self-pay

## 2016-09-04 DIAGNOSIS — E041 Nontoxic single thyroid nodule: Secondary | ICD-10-CM

## 2016-09-11 DIAGNOSIS — Z85828 Personal history of other malignant neoplasm of skin: Secondary | ICD-10-CM | POA: Diagnosis not present

## 2016-09-11 DIAGNOSIS — D225 Melanocytic nevi of trunk: Secondary | ICD-10-CM | POA: Diagnosis not present

## 2016-09-11 DIAGNOSIS — D1800 Hemangioma unspecified site: Secondary | ICD-10-CM | POA: Diagnosis not present

## 2016-09-11 DIAGNOSIS — L814 Other melanin hyperpigmentation: Secondary | ICD-10-CM | POA: Diagnosis not present

## 2016-09-17 ENCOUNTER — Other Ambulatory Visit: Payer: Self-pay | Admitting: Specialist

## 2016-09-17 ENCOUNTER — Other Ambulatory Visit: Payer: Self-pay | Admitting: Family Medicine

## 2016-09-17 NOTE — Addendum Note (Signed)
Addended by: Precious Gilding on: 09/17/2016 01:28 PM   Modules accepted: Orders

## 2016-09-17 NOTE — Addendum Note (Signed)
Addended by: Precious Gilding on: 09/17/2016 02:38 PM   Modules accepted: Orders

## 2016-09-20 ENCOUNTER — Other Ambulatory Visit (HOSPITAL_COMMUNITY)
Admission: RE | Admit: 2016-09-20 | Discharge: 2016-09-20 | Disposition: A | Discharge status: Home / Self Care | Payer: BLUE CROSS/BLUE SHIELD | Source: Ambulatory Visit | Attending: Radiology | Admitting: Radiology

## 2016-09-20 ENCOUNTER — Ambulatory Visit
Admission: RE | Admit: 2016-09-20 | Discharge: 2016-09-20 | Disposition: A | Discharge status: Home / Self Care | Payer: BLUE CROSS/BLUE SHIELD | Source: Ambulatory Visit | Attending: Family Medicine | Admitting: Family Medicine

## 2016-09-20 DIAGNOSIS — D34 Benign neoplasm of thyroid gland: Secondary | ICD-10-CM | POA: Diagnosis not present

## 2016-09-20 DIAGNOSIS — E041 Nontoxic single thyroid nodule: Secondary | ICD-10-CM

## 2016-09-20 NOTE — Procedures (Signed)
Successful US guided FNA biopsy of right thyroid nodule. No complications.  Ascencion Dike PA-C Interventional Radiology 09/20/2016 4:28 PM

## 2016-11-02 IMAGING — DX DG CHEST 2V
2 series · 2 of 2 positions shown · non-contrast
Comparison: None in PACs

CLINICAL DATA: Five days of cough, chest congestion, and shortness
of breath, nonsmoker

EXAM:
CHEST  2 VIEW

[chest pa]
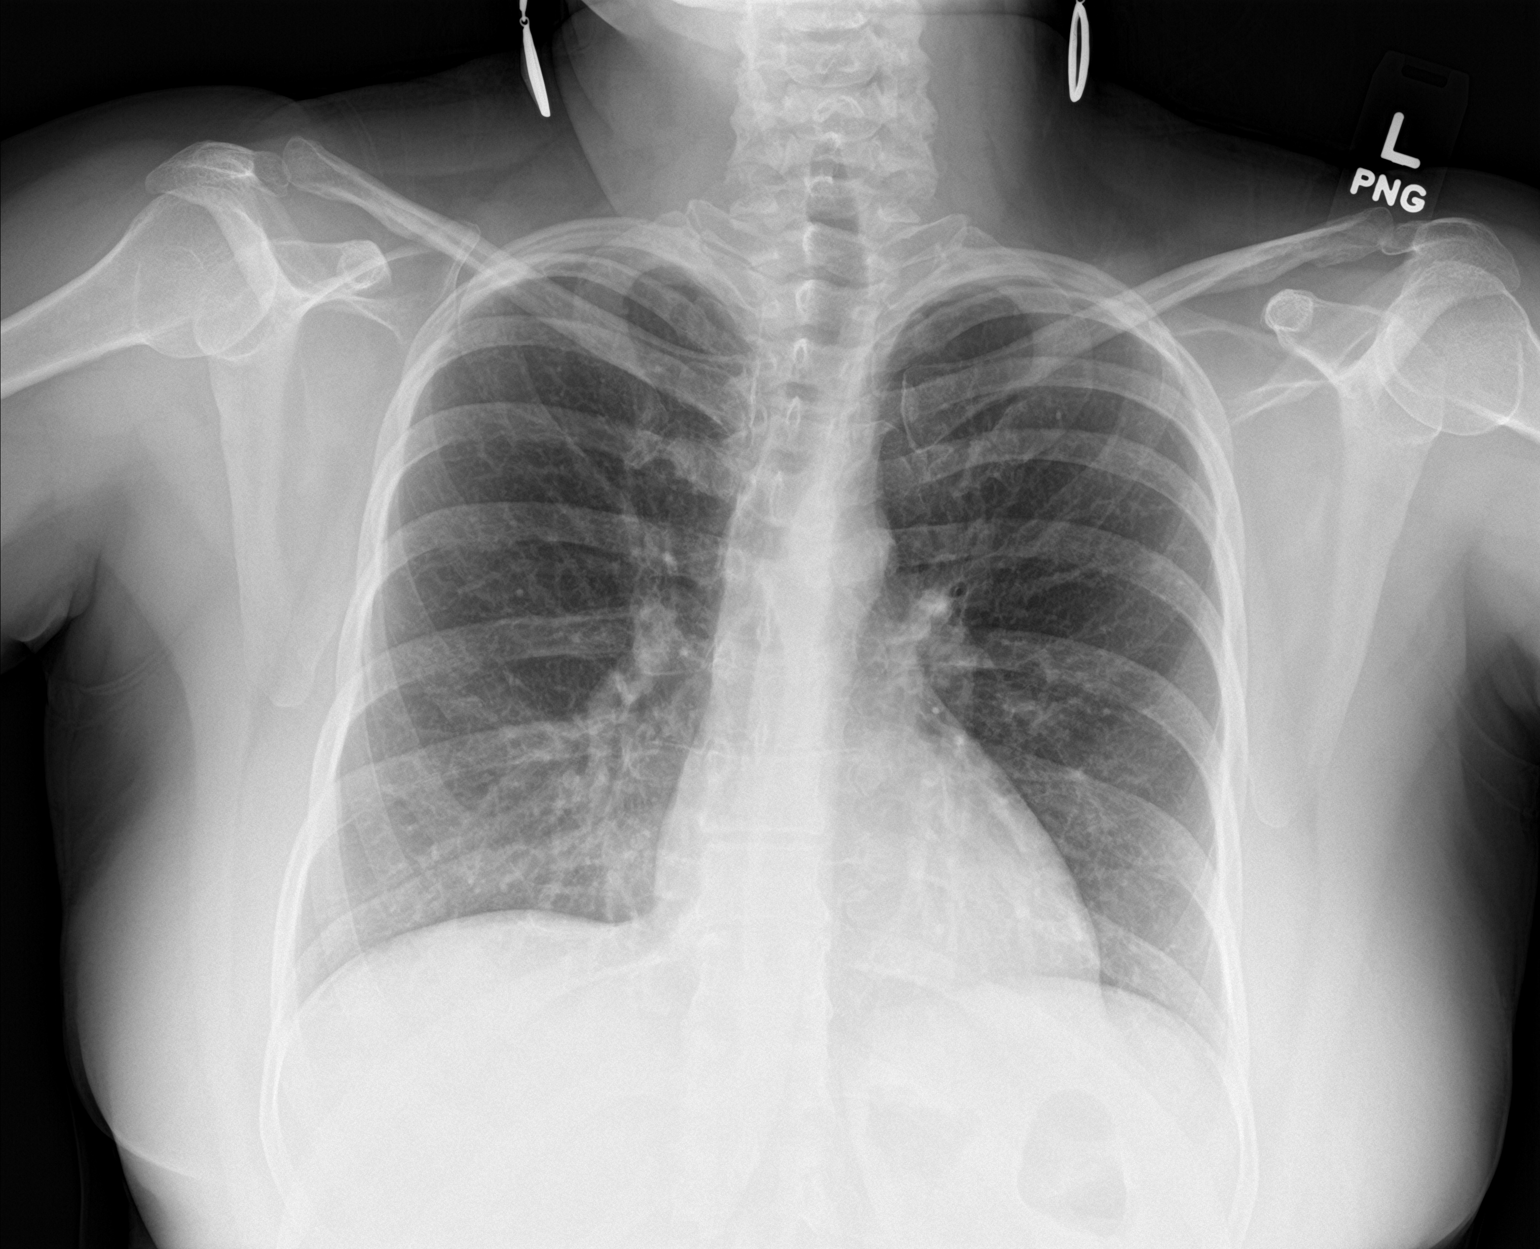

[chest lat]
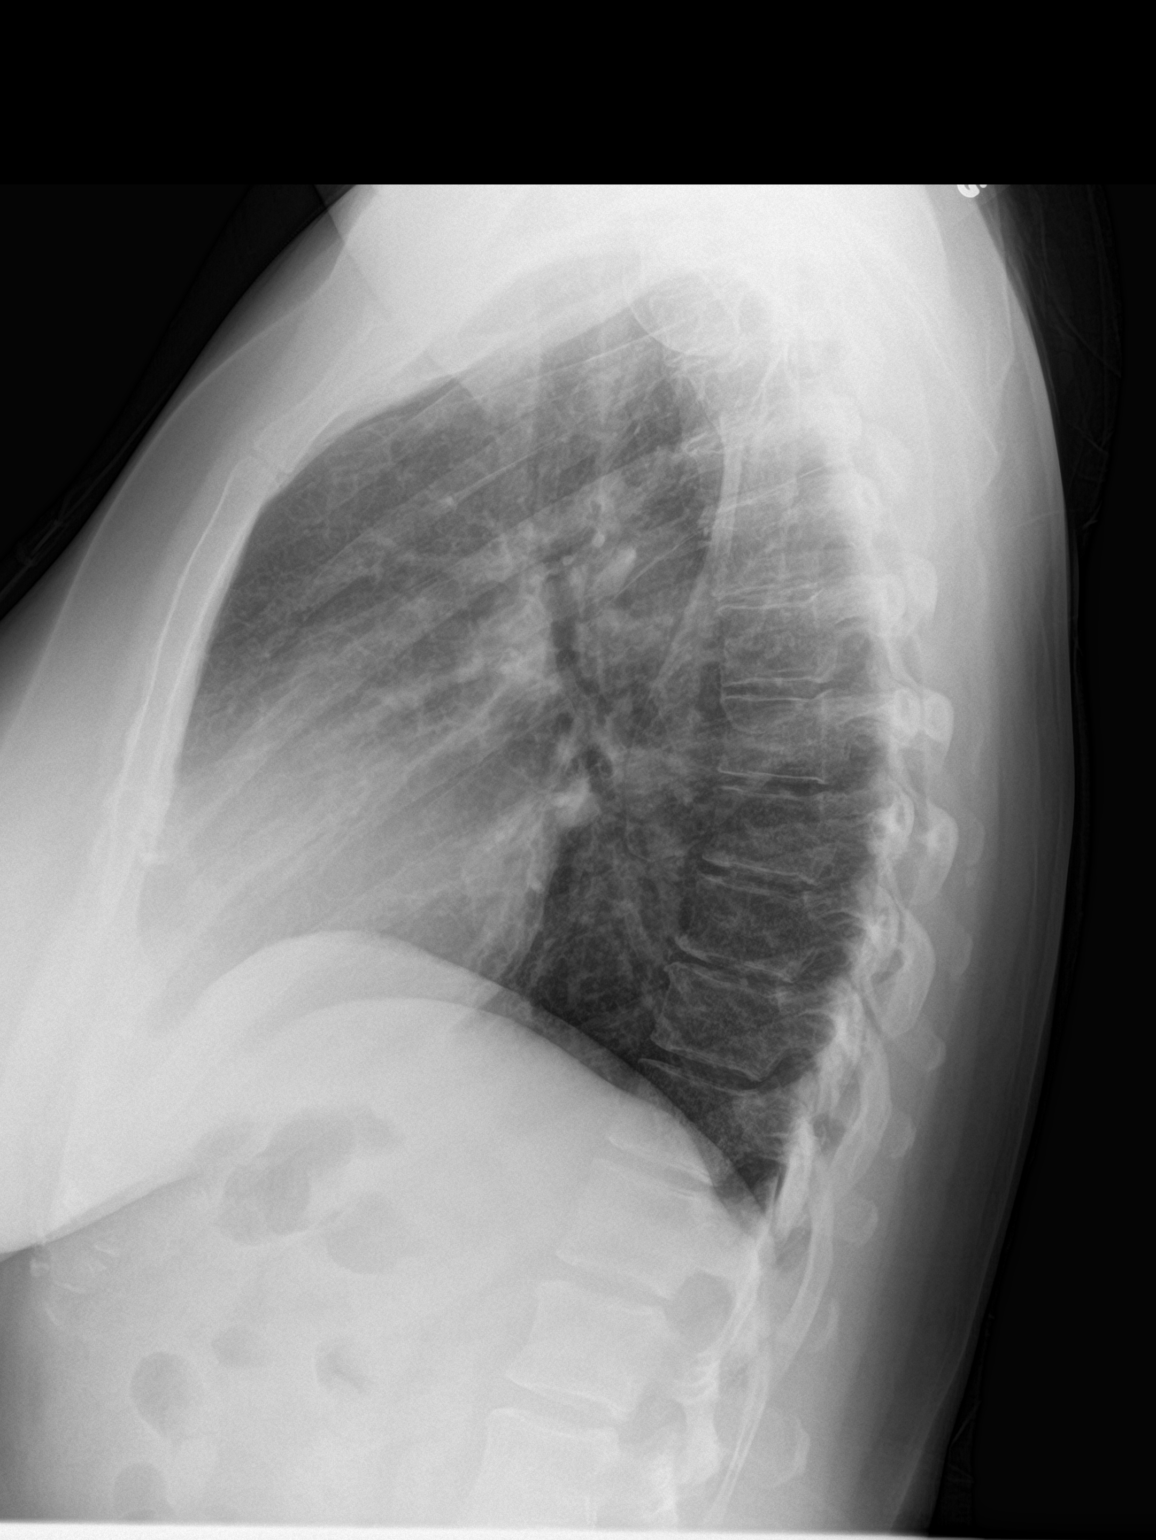

[2 of 2 positions shown; findings below may reference images not displayed]

FINDINGS: The lungs are adequately inflated. The interstitial markings are
increased bilaterally. The heart and pulmonary vascularity are
normal. The mediastinum is normal in width. There is no pleural
effusion or pneumothorax. The bony thorax exhibits no acute
abnormality.
IMPRESSION: Mild diffuse interstitial prominence suggests a pneumonitis type
process. The findings could be acute or chronic however. Follow-up
radiographs following anticipated antibiotic therapy are recommended
to assure clearing.

## 2016-12-24 DIAGNOSIS — H524 Presbyopia: Secondary | ICD-10-CM | POA: Diagnosis not present

## 2017-01-31 DIAGNOSIS — Z01419 Encounter for gynecological examination (general) (routine) without abnormal findings: Secondary | ICD-10-CM | POA: Diagnosis not present

## 2017-01-31 DIAGNOSIS — Z6831 Body mass index (BMI) 31.0-31.9, adult: Secondary | ICD-10-CM | POA: Diagnosis not present

## 2017-01-31 DIAGNOSIS — Z1231 Encounter for screening mammogram for malignant neoplasm of breast: Secondary | ICD-10-CM | POA: Diagnosis not present

## 2017-10-02 ENCOUNTER — Ambulatory Visit (INDEPENDENT_AMBULATORY_CARE_PROVIDER_SITE_OTHER): Payer: BLUE CROSS/BLUE SHIELD | Admitting: Family Medicine

## 2017-10-02 ENCOUNTER — Encounter: Payer: Self-pay | Admitting: Family Medicine

## 2017-10-02 VITALS — BP 110/70 | Temp 98.2°F | Ht 59.5 in | Wt 142.5 lb

## 2017-10-02 DIAGNOSIS — Z23 Encounter for immunization: Secondary | ICD-10-CM | POA: Diagnosis not present

## 2017-10-02 DIAGNOSIS — Z0000 Encounter for general adult medical examination without abnormal findings: Secondary | ICD-10-CM | POA: Diagnosis not present

## 2017-10-02 MED ORDER — SUMATRIPTAN SUCCINATE 100 MG PO TABS: ORAL_TABLET | ORAL | 5 refills | Status: DC

## 2017-10-02 NOTE — Patient Instructions (Signed)
Consider new shingles vaccine (shingrix) and let us know if interested.

## 2017-10-02 NOTE — Progress Notes (Signed)
Subjective:     Patient ID: Ana Rivera, female   DOB: 02-06-1966, 51 y.o.   MRN: [id number]  HPI Patient here for complete physical. She sees gynecologist yearly. She's had previous hysterectomy. She gets mammograms through their office. Colonoscopy is up-to-date. She's done tremendous job with weight loss during the past year through [employer]. She's lost about 25 pounds and feels much better overall. No history of shingles vaccine. Received flu vaccine here today. Nonsmoker. Patient's mother had melanoma and patient sees dermatologist yearly for screening.  Past Medical History:  Diagnosis Date  . Allergy    Past Surgical History:  Procedure Laterality Date  . ABDOMINAL HYSTERECTOMY  2003    reports that she has never smoked. She has never used smokeless tobacco. She reports that she drinks alcohol. Her drug history is not on file. family history includes Cancer (age of onset: 22) in her father; Cancer (age of onset: 87) in her mother; Diabetes in her maternal grandmother; Heart disease (age of onset: 11) in her father. Allergies  Allergen Reactions  . Hydrocodone     Hycodan cough syrup caused vomiting and headache     Review of Systems  Constitutional: Negative for activity change, appetite change, fatigue, fever and unexpected weight change.  HENT: Negative for ear pain, hearing loss, sore throat and trouble swallowing.   Eyes: Negative for visual disturbance.  Respiratory: Negative for cough and shortness of breath.   Cardiovascular: Negative for chest pain and palpitations.  Gastrointestinal: Negative for abdominal pain, blood in stool, constipation and diarrhea.  Genitourinary: Negative for dysuria and hematuria.  Musculoskeletal: Negative for arthralgias, back pain and myalgias.  Skin: Negative for rash.  Neurological: Negative for dizziness, syncope and headaches.  Hematological: Negative for adenopathy.  Psychiatric/Behavioral: Negative for confusion and  dysphoric mood.       Objective:   Physical Exam  Constitutional: She is oriented to person, place, and time. She appears well-developed and well-nourished.  HENT:  Head: Normocephalic and atraumatic.  Eyes: Pupils are equal, round, and reactive to light. EOM are normal.  Neck: Normal range of motion. Neck supple. No thyromegaly present.  Cardiovascular: Normal rate, regular rhythm and normal heart sounds.   No murmur heard. Pulmonary/Chest: Breath sounds normal. No respiratory distress. She has no wheezes. She has no rales.  Abdominal: Soft. Bowel sounds are normal. She exhibits no distension and no mass. There is no tenderness. There is no rebound and no guarding.  Genitourinary:  Genitourinary Comments: Per gyn  Musculoskeletal: Normal range of motion. She exhibits no edema.  Lymphadenopathy:    She has no cervical adenopathy.  Neurological: She is alert and oriented to person, place, and time. She displays normal reflexes. No cranial nerve deficit.  Skin: No rash noted.  Psychiatric: She has a normal mood and affect. Her behavior is normal. Judgment and thought content normal.       Assessment:     Physical exam. Several issues addressed as below    Plan:     -We discussed getting screening labs and since her numbers have been very good in the past we've elected to go to every other year with lab work with things like lipids -Flu vaccine given -Discuss new shingles vaccine and she wishes to check on insurance coverage first -Discussed other age-appropriate screening such as mammography and colonoscopy. She plans to continue with mammograms through GYN practice.  Eulas Post MD [facility] at [facility]

## 2017-10-15 IMAGING — US US SOFT TISSUE HEAD/NECK
1 series · 13 of 25 positions shown · non-contrast
Comparison: None.

CLINICAL DATA: Palpable right anterior lower of neck mass worrisome
for thyroid nodule by physical exam.

EXAM:
THYROID ULTRASOUND
TECHNIQUE: Ultrasound examination of the thyroid gland and adjacent soft
tissues was performed.

[Series 1: us soft tissue head/neck · 0.06mm/px · 13 of 64 slices shown]
[im 1/64]
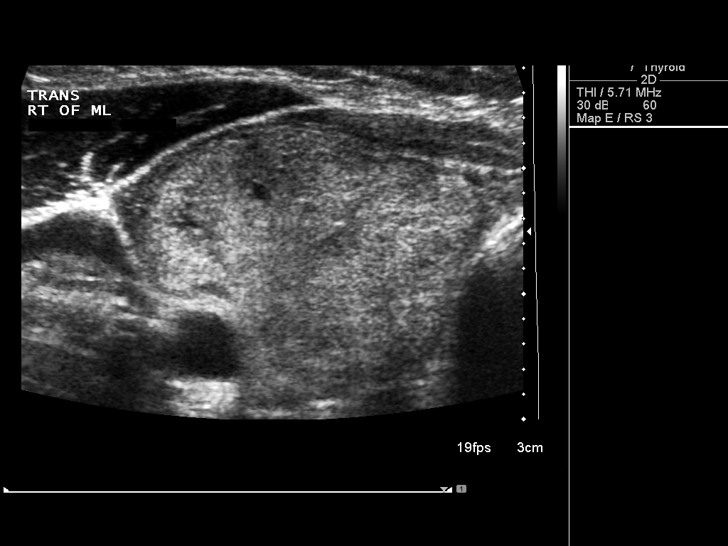
[im 6/64]
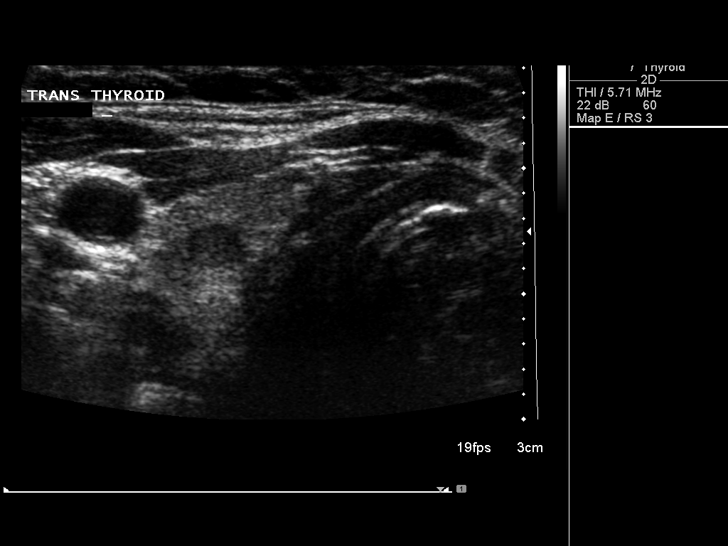
[im 11/64]
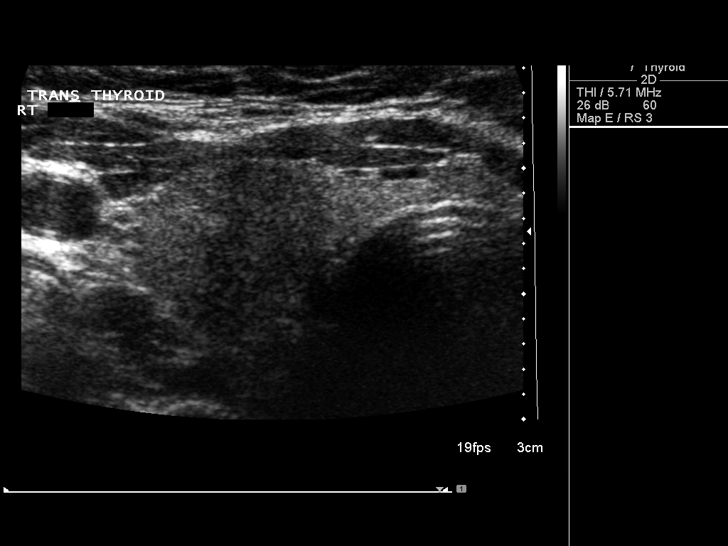
[im 16/64]
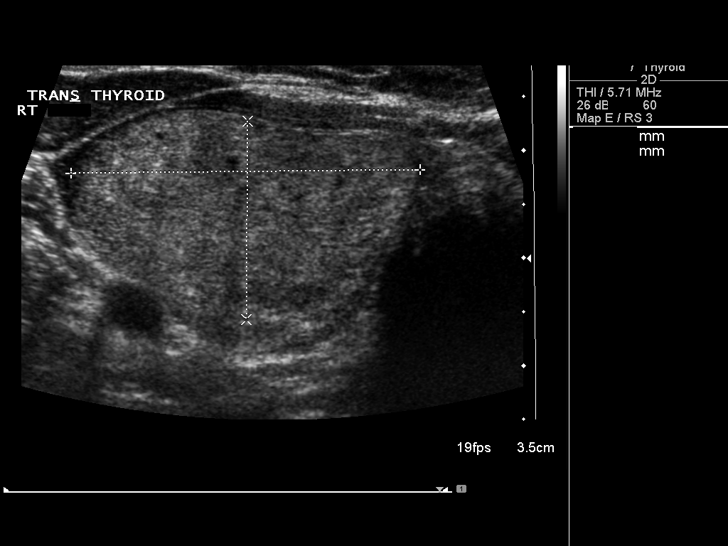
[im 22/64]
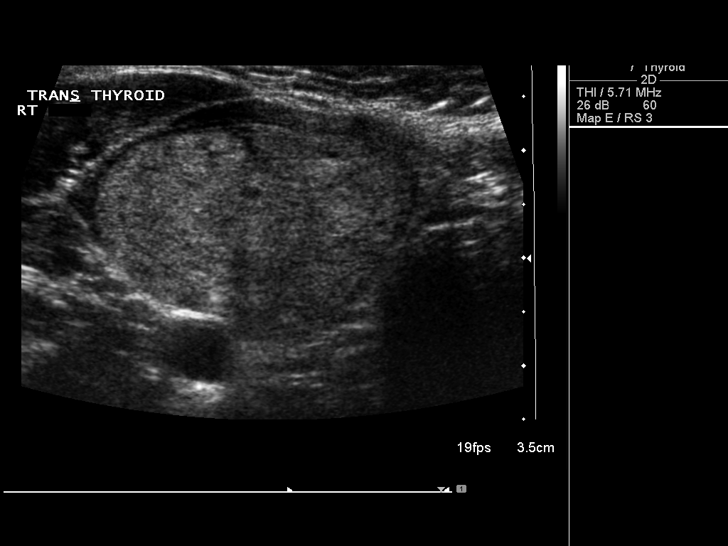
[im 27/64]
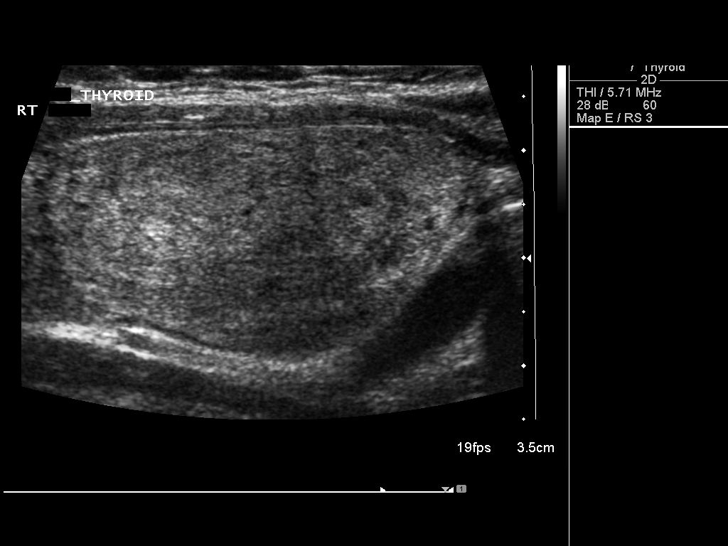
[im 32/64]
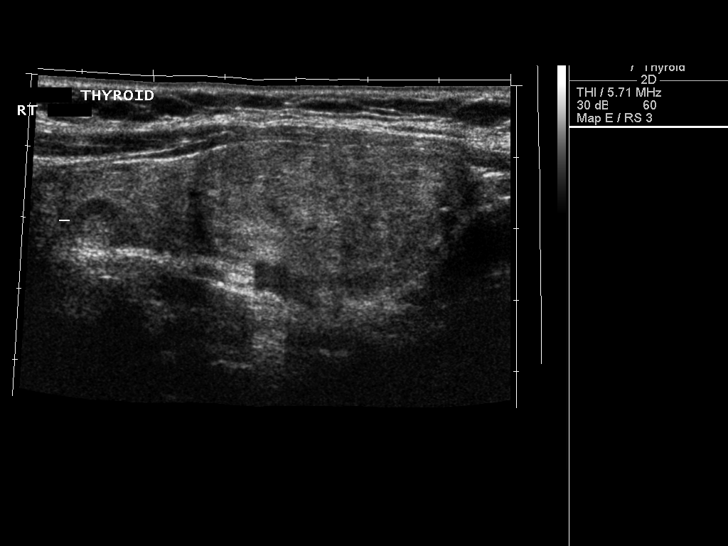
[im 37/64]
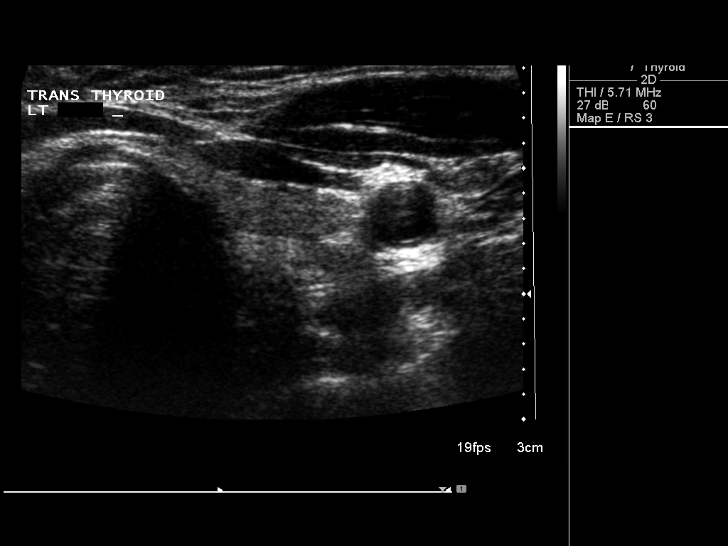
[im 43/64]
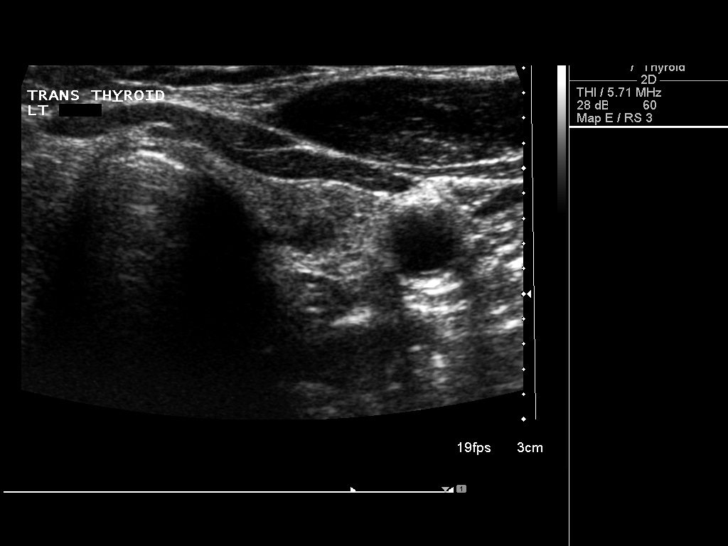
[im 48/64]
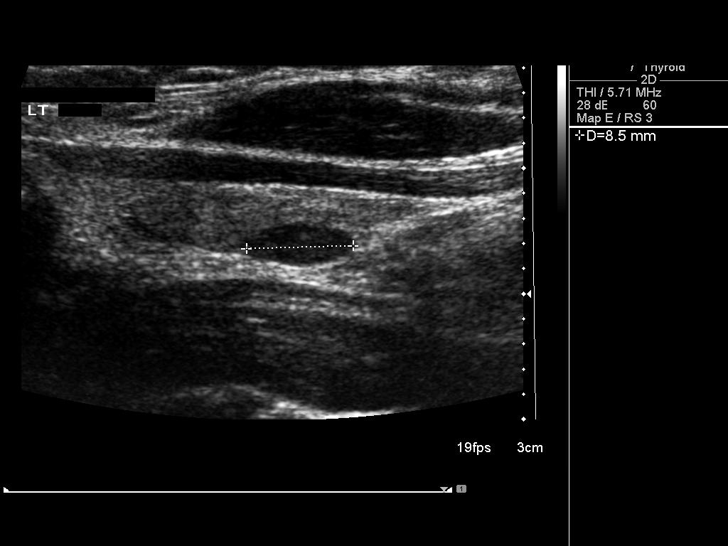
[im 53/64]
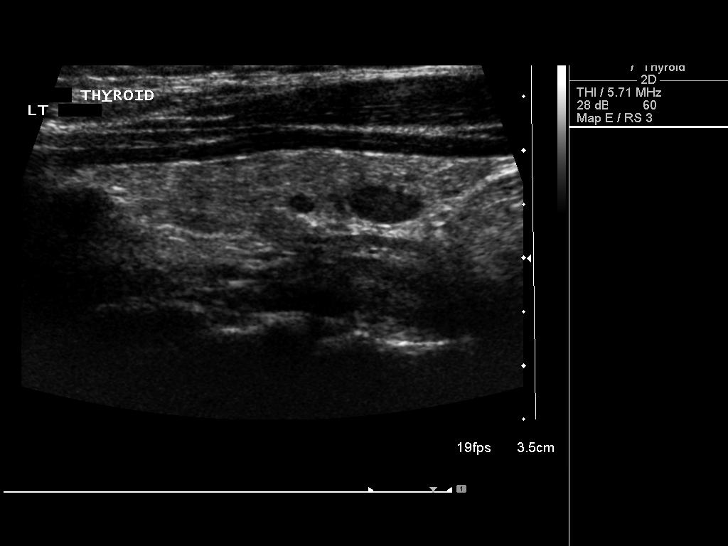
[im 58/64]
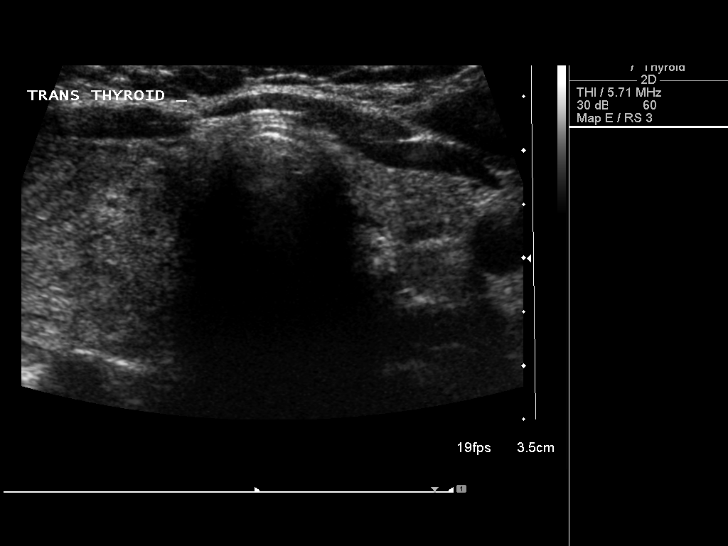
[im 64/64]
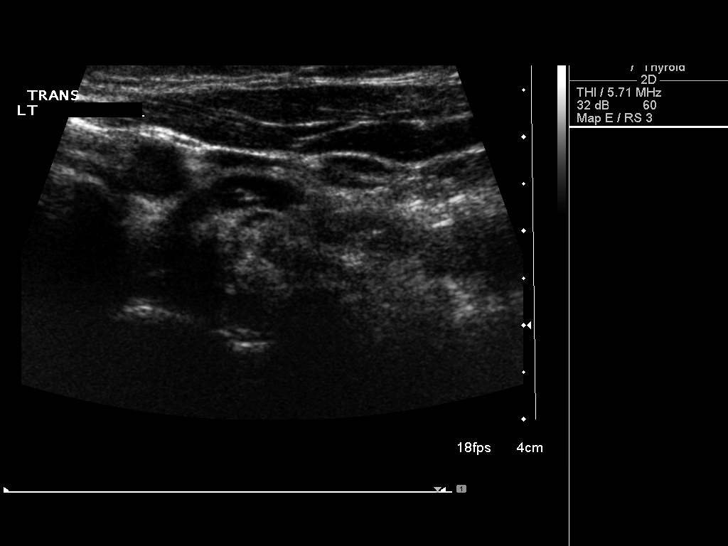

[13 of 25 positions shown; findings below may reference images not displayed]

FINDINGS: Parenchymal Echotexture: Mildly heterogenous

Estimated total number of nodules >/= 1 cm: 1

Number of spongiform nodules > 2 cm not described below (TR1): 0

Number of mixed cystic and solid nodules > 1.5 cm not described
below (TR2): 0

_________________________________________________________

Isthmus: 0.1 cm

No discrete nodules are identified within the thyroid isthmus.

_________________________________________________________

Right lobe: 6.4 x 2.2 x 3.3 cm

Nodule # 1:

Location: Right; Inferior

Size: 4.0 x 1.8 x 3.2 cm

Composition: solid/almost completely solid (2)

Echogenicity: isoechoic (1)

Shape: not taller-than-wide (0)

Margins: smooth (0)

Echogenic foci: none (0)

ACR TI-RADS total points: 3.

ACR TI-RADS risk category: TR3 (3 points).

ACR TI-RADS recommendations:

**Given size (>2.5 cm) and appearance, fine needle aspiration of
this mildly suspicious nodule should be considered based on TI-RADS
criteria.

Nodule # 2:

Location: Right; Superior

Size: 0.6 cm

Composition: solid/almost completely solid (2)

Echogenicity: hypoechoic (2)

Shape: not taller-than-wide (0)

Margins: smooth (0)

Echogenic foci: none (0)

ACR TI-RADS total points: 4.

ACR TI-RADS risk category: TR4 (4-6 points).

ACR TI-RADS recommendations:

Given size (<0.9 cm) and appearance, this nodule does NOT meet
TI-RADS criteria for biopsy or dedicated follow-up.

_________________________________________________________

Left lobe: 3.9 x 0.8 x 1.3 cm

Nodule # 3:

Location: Left; Inferior

Size: 0.9 x 0.4 x 0.6 cm

Composition: solid/almost completely solid (2)

Echogenicity: hypoechoic (2)

Shape: not taller-than-wide (0)

Margins: smooth (0)

Echogenic foci: none (0)

ACR TI-RADS total points: 4.

ACR TI-RADS risk category: TR4 (4-6 points).

ACR TI-RADS recommendations:

Given size (<0.9 cm) and appearance, this nodule does NOT meet
TI-RADS criteria for biopsy or dedicated follow-up.
IMPRESSION: Bilateral nodules as described. The dominant right lower pole nodule
measures 4.0 cm and meets criteria for fine needle aspiration
biopsy. This likely corresponds to the palpable abnormality.

The above is in keeping with the ACR TI-RADS recommendations - [HOSPITAL] 7691;[DATE].

## 2017-11-01 IMAGING — US US THYROID BIOPSY
1 series · 12 of 12 positions shown · non-contrast
Comparison: Thyroid ultrasound performed 09/03/2016.

MEDICATIONS:
None

COMPLICATIONS:
None immediate.

INDICATION: Indeterminate right thyroid nodule

EXAM:
ULTRASOUND GUIDED FINE NEEDLE ASPIRATION OF INDETERMINATE RIGHT
THYROID NODULE
TECHNIQUE: Informed written consent was obtained from the patient after a
discussion of the risks, benefits and alternatives to treatment.
Questions regarding the procedure were encouraged and answered. A
timeout was performed prior to the initiation of the procedure.

[Series 1: us thyroid biopsy · 0.08mm/px · 12 acquisitions, 12 frames shown]
[im 1/12]
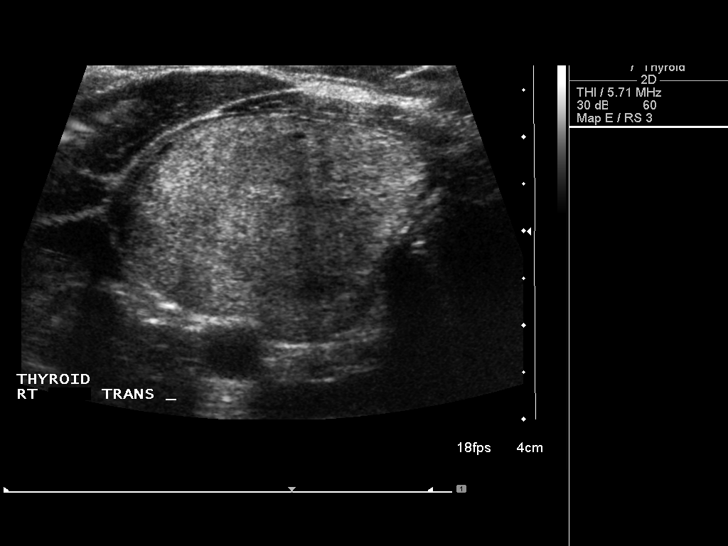
[im 2/12]
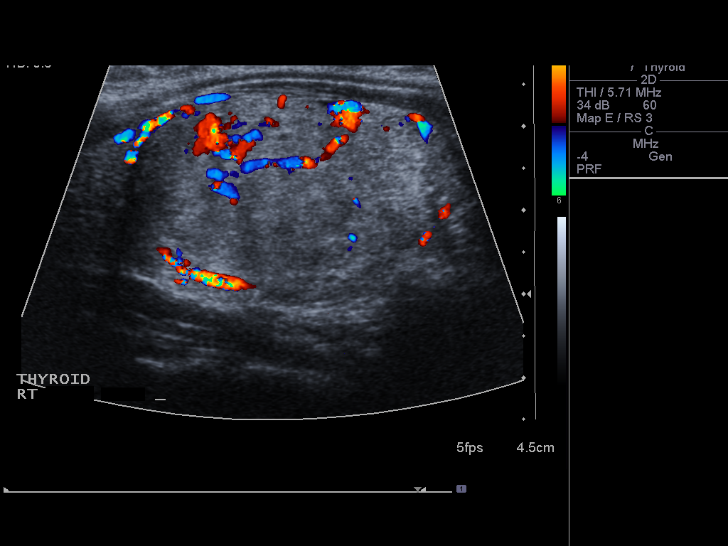
[im 3/12]
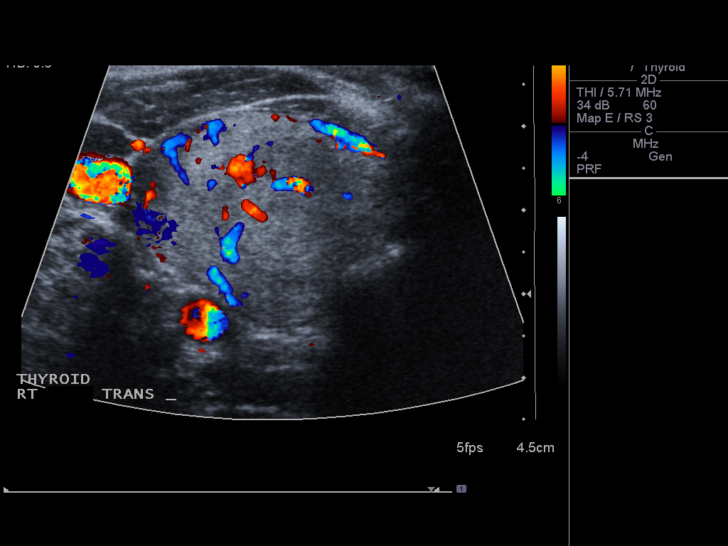
[im 4/12]
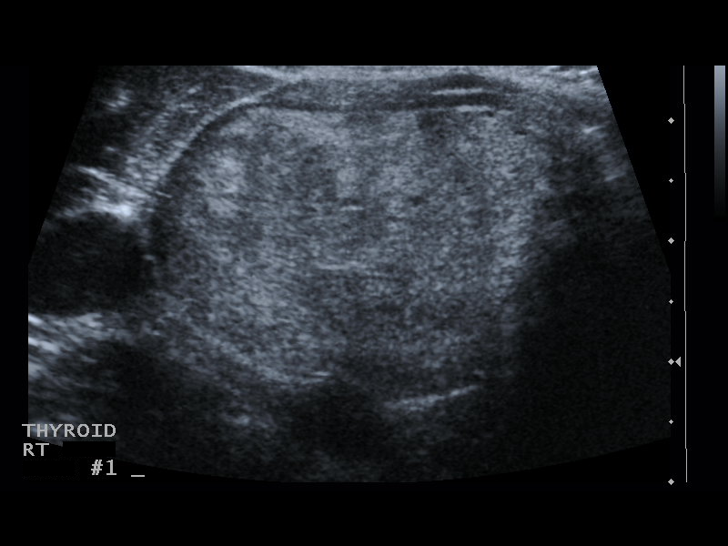
[im 5/12]
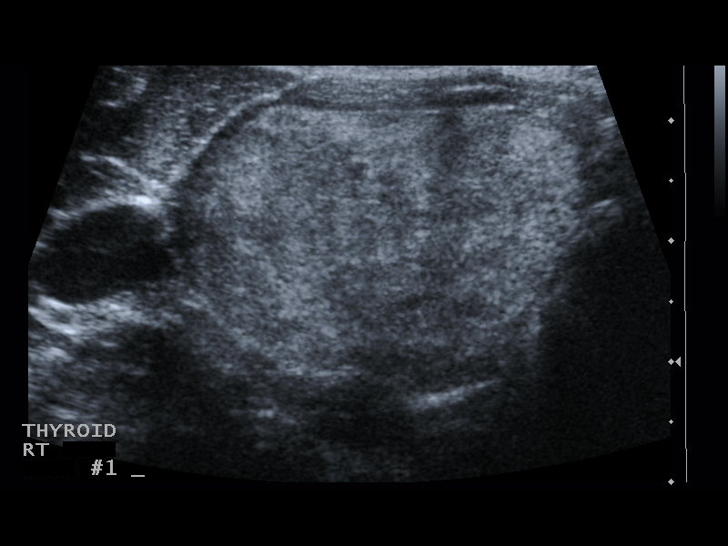
[im 6/12]
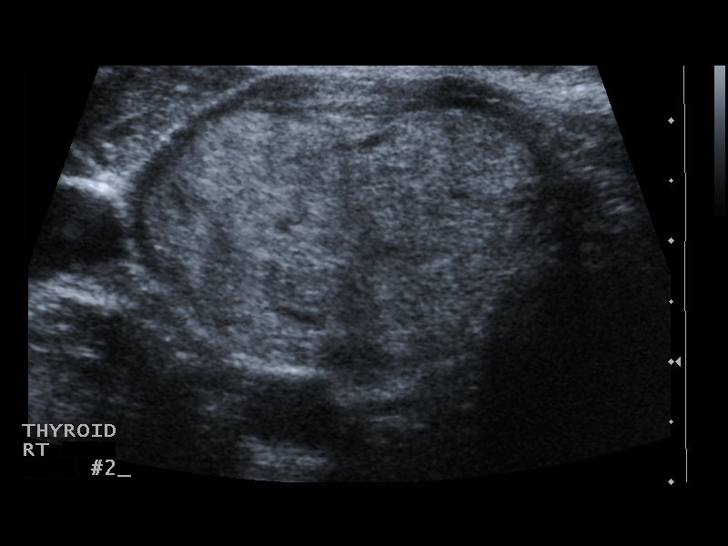
[im 7/12]
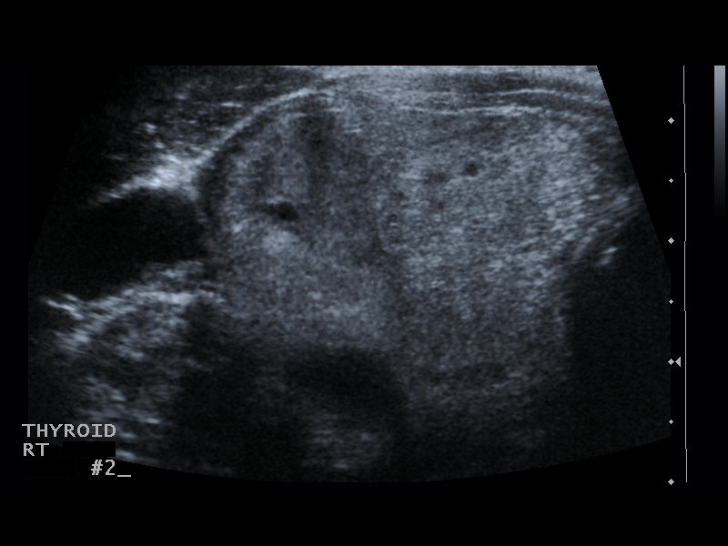
[im 8/12]
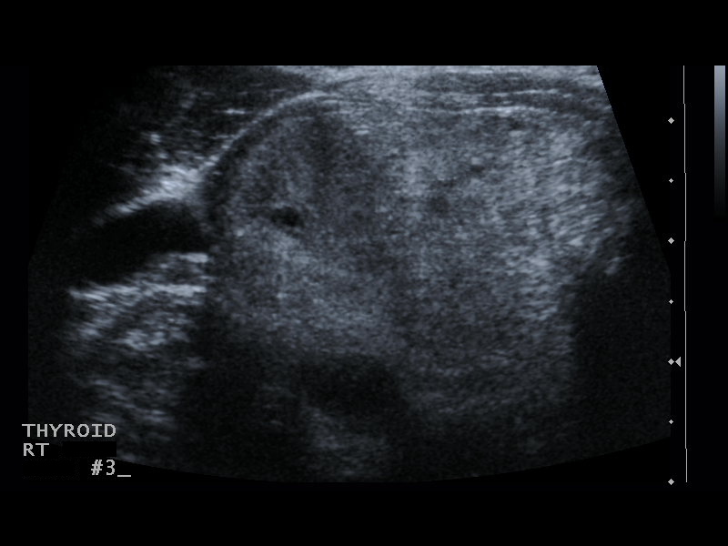
[im 9/12]
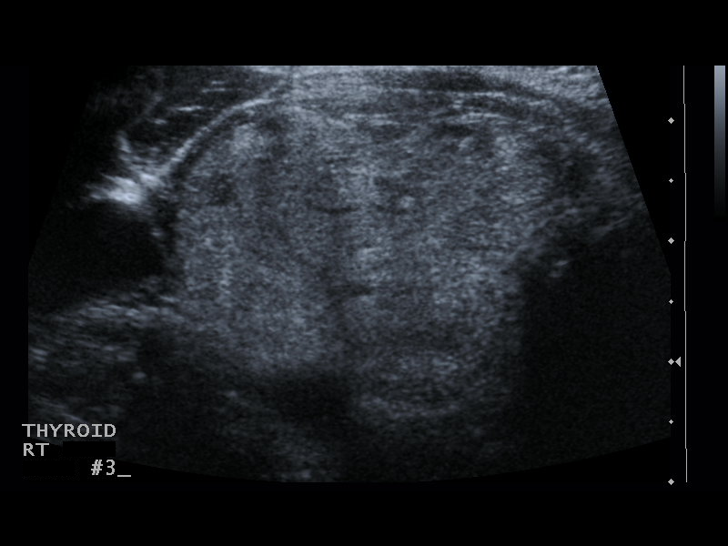
[im 10/12]
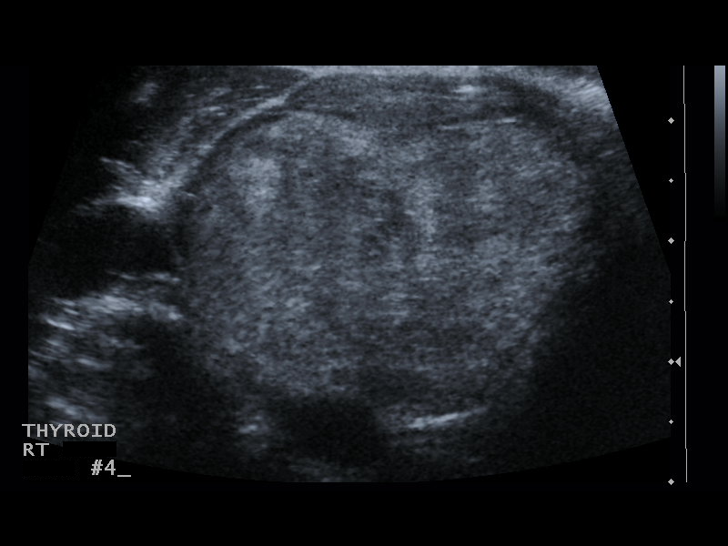
[im 11/12]
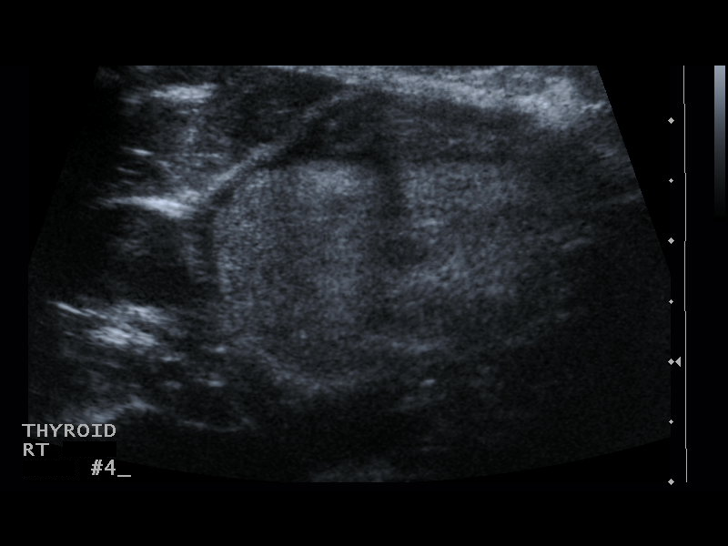
[im 12/12]
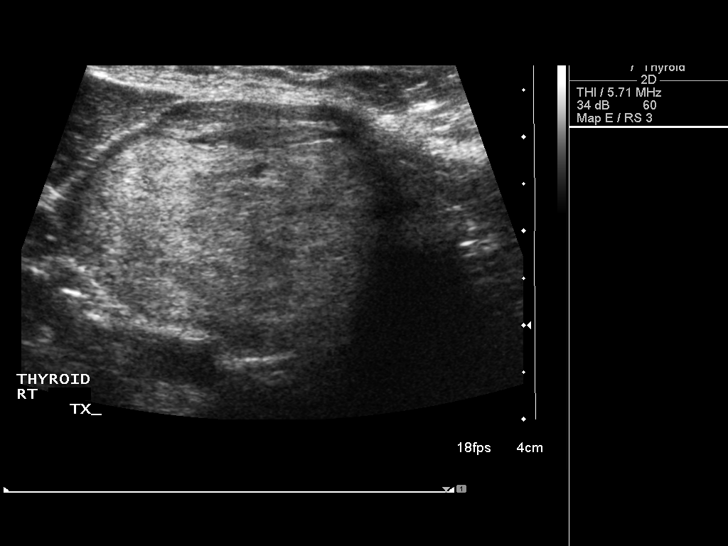

[12 of 12 positions shown; findings below may reference images not displayed]

Pre-procedural ultrasound scanning demonstrated unchanged size and
appearance of the indeterminate nodule within the right inferior
thyroid lobe.

The procedure was planned. The neck was prepped in the usual sterile
fashion, and a sterile drape was applied covering the operative
field. A timeout was performed prior to the initiation of the
procedure. Local anesthesia was provided with 1% lidocaine.

Under direct ultrasound guidance, 4 FNA biopsies were performed of
the right inferior thyroid nodule with a 25 gauge needle. Multiple
ultrasound images were saved for procedural documentation purposes.
The samples were prepared and submitted to pathology.

Limited post procedural scanning was negative for hematoma or
additional complication. Dressings were placed. The patient
tolerated the above procedures procedure well without immediate
postprocedural complication.
FINDINGS: FINDINGS
Nodule reference number based on prior diagnostic ultrasound: 1

Maximum size:  4.0 cm

Location: Right  ;  Inferior

ACR TI-RADS total points: 3

ACR TI-RADS risk category:  3

Prior biopsy:  No

Reason for biopsy: meets ACR TI-RADS criteria

Ultrasound imaging confirms appropriate placement of the needles
within the thyroid nodule.
IMPRESSION: Technically successful ultrasound guided fine needle aspiration of
right inferior thyroid nodule as described above.

## 2018-01-21 DIAGNOSIS — H524 Presbyopia: Secondary | ICD-10-CM | POA: Diagnosis not present

## 2018-02-10 DIAGNOSIS — Z6826 Body mass index (BMI) 26.0-26.9, adult: Secondary | ICD-10-CM | POA: Diagnosis not present

## 2018-02-10 DIAGNOSIS — Z1231 Encounter for screening mammogram for malignant neoplasm of breast: Secondary | ICD-10-CM | POA: Diagnosis not present

## 2018-02-10 DIAGNOSIS — Z01419 Encounter for gynecological examination (general) (routine) without abnormal findings: Secondary | ICD-10-CM | POA: Diagnosis not present

## 2018-02-10 LAB — HM MAMMOGRAPHY

## 2018-02-12 ENCOUNTER — Encounter: Payer: Self-pay | Admitting: Family Medicine

## 2018-03-25 DIAGNOSIS — Z85828 Personal history of other malignant neoplasm of skin: Secondary | ICD-10-CM | POA: Diagnosis not present

## 2018-03-25 DIAGNOSIS — D1800 Hemangioma unspecified site: Secondary | ICD-10-CM | POA: Diagnosis not present

## 2018-03-25 DIAGNOSIS — D225 Melanocytic nevi of trunk: Secondary | ICD-10-CM | POA: Diagnosis not present

## 2018-03-25 DIAGNOSIS — L814 Other melanin hyperpigmentation: Secondary | ICD-10-CM | POA: Diagnosis not present

## 2018-03-30 ENCOUNTER — Other Ambulatory Visit: Payer: Self-pay | Admitting: Family Medicine

## 2018-12-07 DIAGNOSIS — J069 Acute upper respiratory infection, unspecified: Secondary | ICD-10-CM | POA: Diagnosis not present

## 2019-02-03 DIAGNOSIS — H524 Presbyopia: Secondary | ICD-10-CM | POA: Diagnosis not present

## 2019-02-25 DIAGNOSIS — Z6828 Body mass index (BMI) 28.0-28.9, adult: Secondary | ICD-10-CM | POA: Diagnosis not present

## 2019-02-25 DIAGNOSIS — Z809 Family history of malignant neoplasm, unspecified: Secondary | ICD-10-CM | POA: Diagnosis not present

## 2019-02-25 DIAGNOSIS — Z1231 Encounter for screening mammogram for malignant neoplasm of breast: Secondary | ICD-10-CM | POA: Diagnosis not present

## 2019-02-25 DIAGNOSIS — Z01419 Encounter for gynecological examination (general) (routine) without abnormal findings: Secondary | ICD-10-CM | POA: Diagnosis not present

## 2019-06-25 ENCOUNTER — Other Ambulatory Visit: Payer: Self-pay | Admitting: Family Medicine

## 2019-07-31 ENCOUNTER — Encounter: Payer: BLUE CROSS/BLUE SHIELD | Admitting: Family Medicine

## 2019-09-11 ENCOUNTER — Ambulatory Visit (INDEPENDENT_AMBULATORY_CARE_PROVIDER_SITE_OTHER): Payer: 59 | Admitting: Family Medicine

## 2019-09-11 ENCOUNTER — Other Ambulatory Visit: Payer: Self-pay

## 2019-09-11 ENCOUNTER — Encounter: Payer: Self-pay | Admitting: Family Medicine

## 2019-09-11 VITALS — BP 130/60 | HR 82 | Temp 97.9°F | Wt 148.6 lb

## 2019-09-11 DIAGNOSIS — Z23 Encounter for immunization: Secondary | ICD-10-CM

## 2019-09-11 DIAGNOSIS — Z0000 Encounter for general adult medical examination without abnormal findings: Secondary | ICD-10-CM | POA: Diagnosis not present

## 2019-09-11 LAB — BASIC METABOLIC PANEL
BUN: 16 mg/dL (ref 6–23)
CO2: 26 mEq/L (ref 19–32)
Calcium: 9.8 mg/dL (ref 8.4–10.5)
Chloride: 104 mEq/L (ref 96–112)
Creatinine, Ser: 0.95 mg/dL (ref 0.40–1.20)
GFR: 61.12 mL/min (ref >60.00)
Glucose, Bld: 89 mg/dL (ref 70–99)
Potassium: 4.4 mEq/L (ref 3.5–5.1)
Sodium: 139 mEq/L (ref 135–145)

## 2019-09-11 LAB — TSH: TSH: 2.04 u[IU]/mL (ref 0.35–4.50)

## 2019-09-11 LAB — LIPID PANEL
Cholesterol: 173 mg/dL (ref 0–200)
HDL: 59.5 mg/dL (ref >39.00)
LDL Cholesterol: 96 mg/dL (ref 0–99)
NonHDL: 113.85
Total CHOL/HDL Ratio: 3
Triglycerides: 89 mg/dL (ref 0.0–149.0)
VLDL: 17.8 mg/dL (ref 0.0–40.0)

## 2019-09-11 LAB — CBC WITH DIFFERENTIAL/PLATELET
Basophils Absolute: 0 10*3/uL (ref 0.0–0.1)
Basophils Relative: 0.7 % (ref 0.0–3.0)
Eosinophils Absolute: 0.1 10*3/uL (ref 0.0–0.7)
Eosinophils Relative: 2 % (ref 0.0–5.0)
HCT: 40 % (ref 36.0–46.0)
Hemoglobin: 13.5 g/dL (ref 12.0–15.0)
Lymphocytes Relative: 26.9 % (ref 12.0–46.0)
Lymphs Abs: 1.9 10*3/uL (ref 0.7–4.0)
MCHC: 33.7 g/dL (ref 30.0–36.0)
MCV: 89.7 fl (ref 78.0–100.0)
Monocytes Absolute: 0.6 10*3/uL (ref 0.1–1.0)
Monocytes Relative: 8.2 % (ref 3.0–12.0)
Neutro Abs: 4.3 10*3/uL (ref 1.4–7.7)
Neutrophils Relative %: 62.2 % (ref 43.0–77.0)
Platelets: 310 10*3/uL (ref 150.0–400.0)
RBC: 4.46 Mil/uL (ref 3.87–5.11)
RDW: 12.5 % (ref 11.5–15.5)
WBC: 6.9 10*3/uL (ref 4.0–10.5)

## 2019-09-11 LAB — HEPATIC FUNCTION PANEL
ALT: 13 U/L (ref 0–35)
AST: 20 U/L (ref 0–37)
Albumin: 4.3 g/dL (ref 3.5–5.2)
Alkaline Phosphatase: 49 U/L (ref 39–117)
Bilirubin, Direct: 0.1 mg/dL (ref 0.0–0.3)
Total Bilirubin: 0.5 mg/dL (ref 0.2–1.2)
Total Protein: 7.1 g/dL (ref 6.0–8.3)

## 2019-09-11 MED ORDER — AZELASTINE HCL 0.1 % NA SOLN: 2.0000 | Freq: Two times a day (BID) | NASAL | 11 refills | Status: AC

## 2019-09-11 MED ORDER — SUMATRIPTAN SUCCINATE 100 MG PO TABS: ORAL_TABLET | ORAL | 5 refills | Status: AC

## 2019-09-11 NOTE — Progress Notes (Signed)
Subjective:     Patient ID: Ana Rivera, female   DOB: 01/31/1966, 53 y.o.   MRN: [id number]  HPI Patient here for physical exam.  Generally very healthy.  She has history of migraine headaches and rarely takes Imitrex for those.  She has history of some seasonal allergies and takes Astelin.  She sees gynecologist for her Pap smears and mammograms.  She also sees dermatologist yearly.  Her mother had melanoma.  Patient has history of benign follicular nodule right thyroid.  She is not aware of any recent growth.  Biopsy was 2017.  No flu vaccine.  No history of shingles vaccine.  No history of hepatitis C screening.  She is low risk.  She recently took up kickboxing class and has lost some weight and feels good overall.  Non-smoker.  Past Medical History:  Diagnosis Date  . Allergy    Past Surgical History:  Procedure Laterality Date  . ABDOMINAL HYSTERECTOMY  2003    reports that she has never smoked. She has never used smokeless tobacco. She reports current alcohol use. No history on file for drug. family history includes Cancer (age of onset: 75) in her father; Cancer (age of onset: 36) in her mother; Diabetes in her maternal grandmother; Heart disease (age of onset: 86) in her father. Allergies  Allergen Reactions  . Hydrocodone     Hycodan cough syrup caused vomiting and headache     Review of Systems  Constitutional: Negative for activity change, appetite change, fatigue, fever and unexpected weight change.  HENT: Negative for ear pain, hearing loss, sore throat and trouble swallowing.   Eyes: Negative for visual disturbance.  Respiratory: Negative for cough and shortness of breath.   Cardiovascular: Negative for chest pain and palpitations.  Gastrointestinal: Negative for abdominal pain, blood in stool, constipation and diarrhea.  Endocrine: Negative for polydipsia and polyuria.  Genitourinary: Negative for dysuria and hematuria.  Musculoskeletal: Negative for  arthralgias, back pain and myalgias.  Skin: Negative for rash.  Neurological: Negative for dizziness, syncope and headaches.  Hematological: Negative for adenopathy.  Psychiatric/Behavioral: Negative for confusion and dysphoric mood.       Objective:   Physical Exam Constitutional:      Appearance: She is well-developed.  HENT:     Head: Normocephalic and atraumatic.  Eyes:     Pupils: Pupils are equal, round, and reactive to light.  Neck:     Musculoskeletal: Normal range of motion and neck supple.     Thyroid: No thyromegaly.  Cardiovascular:     Rate and Rhythm: Normal rate and regular rhythm.     Heart sounds: Normal heart sounds. No murmur.  Pulmonary:     Effort: No respiratory distress.     Breath sounds: Normal breath sounds. No wheezing or rales.  Abdominal:     General: Bowel sounds are normal. There is no distension.     Palpations: Abdomen is soft. There is no mass.     Tenderness: There is no abdominal tenderness. There is no guarding or rebound.  Genitourinary:    Comments: Per GYN Musculoskeletal: Normal range of motion.  Lymphadenopathy:     Cervical: No cervical adenopathy.  Skin:    Findings: No rash.  Neurological:     Mental Status: She is alert and oriented to person, place, and time.     Cranial Nerves: No cranial nerve deficit.     Deep Tendon Reflexes: Reflexes normal.  Psychiatric:        Behavior:  Behavior normal.        Thought Content: Thought content normal.        Judgment: Judgment normal.        Assessment:     Physical exam.  Generally healthy 53 year old female.  We discussed the following health maintenance issues    Plan:     -Flu vaccine given -Discussed shingles vaccine and she will check on insurance coverage -Obtain screening lab work including hepatitis C antibody -She plans to continue GYN follow-up for Pap smears and mammograms -Continue regular exercise habits  Eulas Post MD [facility] at  [facility]

## 2019-09-11 NOTE — Patient Instructions (Signed)
Consider shingles vaccine (Shingrix) and check on insurance coverage if interested.   

## 2019-09-14 ENCOUNTER — Telehealth: Payer: Self-pay | Admitting: *Deleted

## 2019-09-14 LAB — HEPATITIS C ANTIBODY
Hepatitis C Ab: NONREACTIVE
SIGNAL TO CUT-OFF: 0.01 (ref <1.00)

## 2019-09-14 NOTE — Telephone Encounter (Signed)
Patient returned call to obtain lab results from tests done on 09/11/2019.  Per Dr. Elease Hashimoto, notified patient that all labs were normal.

## 2020-03-21 LAB — HM MAMMOGRAPHY

## 2020-03-22 ENCOUNTER — Encounter: Payer: Self-pay | Admitting: Family Medicine

## 2020-11-02 ENCOUNTER — Encounter: Payer: Self-pay | Admitting: Family Medicine

## 2020-11-02 ENCOUNTER — Ambulatory Visit (INDEPENDENT_AMBULATORY_CARE_PROVIDER_SITE_OTHER): Payer: 59 | Admitting: Family Medicine

## 2020-11-02 ENCOUNTER — Other Ambulatory Visit: Payer: Self-pay

## 2020-11-02 VITALS — BP 120/78 | HR 77 | Temp 98.1°F | Ht 60.5 in | Wt 148.4 lb

## 2020-11-02 DIAGNOSIS — Z0000 Encounter for general adult medical examination without abnormal findings: Secondary | ICD-10-CM

## 2020-11-02 DIAGNOSIS — Z23 Encounter for immunization: Secondary | ICD-10-CM

## 2020-11-02 MED ORDER — METHOCARBAMOL 500 MG PO TABS: 500.0000 mg | ORAL_TABLET | Freq: Three times a day (TID) | ORAL | 0 refills | Status: AC | PRN

## 2020-11-02 NOTE — Progress Notes (Signed)
Established Patient Office Visit  Subjective:  Patient ID: Ana Rivera, female    DOB: Oct 26, 1966  Age: 54 y.o. MRN: [id number]  CC:  Chief Complaint  Patient presents with  . Annual Exam    HPI Ana Rivera presents for physical exam. She sees her gynecologist yearly and gets occasional Pap smears yearly she has had prior hysterectomy and mammograms through them. She has history of migraine headaches and some allergy issues but otherwise fairly healthy. She is exercising regularly.  She has recently noticed some pain in her right thoracic area just below the scapular region. She thinks this is muscular. Denies any specific injury. She thinks muscles feel tight frequently at night in this region. Does not have any pain actually when she is exercising in this region. No recent cough or dyspnea. No pleuritic pain.  Family history reviewed-father had history of stroke and CABG at 15. Mom had history of melanoma.  Social history-she is married. Husband diagnosed recently with Parkinson's disease. Ana Rivera has never smoked. No regular alcohol use. She works with IT consultant.  Health maintenance Health Maintenance  Topic Date Due  . HIV Screening  Never done  . INFLUENZA VACCINE  07/10/2020  . PAP SMEAR-Modifier  10/03/2047 (Originally 12/14/2014)  . MAMMOGRAM  03/21/2022  . TETANUS/TDAP  09/08/2024  . COLONOSCOPY  03/20/2025  . COVID-19 Vaccine  Completed  . Hepatitis C Screening  Completed     Past Medical History:  Diagnosis Date  . Allergy     Past Surgical History:  Procedure Laterality Date  . ABDOMINAL HYSTERECTOMY  2003    Family History  Problem Relation Age of Onset  . Cancer Mother 69       melanoma  . Cancer Father 70       bladder and prostate  . Heart disease Father 54       CABG  . Stroke Father   . Diabetes Maternal Grandmother     Social History   Socioeconomic History  . Marital status: Married    Spouse name: Not on file  . Number of  children: Not on file  . Years of education: Not on file  . Highest education level: Not on file  Occupational History  . Not on file  Tobacco Use  . Smoking status: Never Smoker  . Smokeless tobacco: Never Used  Substance and Sexual Activity  . Alcohol use: Yes    Comment: social  . Drug use: Not on file  . Sexual activity: Yes    Comment: married  Other Topics Concern  . Not on file  Social History Narrative  . Not on file   Social Determinants of Health   Financial Resource Strain:   . Difficulty of Paying Living Expenses: Not on file  Food Insecurity:   . Worried About Charity fundraiser in the Last Year: Not on file  . Ran Out of Food in the Last Year: Not on file  Transportation Needs:   . Lack of Transportation (Medical): Not on file  . Lack of Transportation (Non-Medical): Not on file  Physical Activity:   . Days of Exercise per Week: Not on file  . Minutes of Exercise per Session: Not on file  Stress:   . Feeling of Stress : Not on file  Social Connections:   . Frequency of Communication with Friends and Family: Not on file  . Frequency of Social Gatherings with Friends and Family: Not on file  . Attends Religious  Services: Not on file  . Active Member of Clubs or Organizations: Not on file  . Attends Archivist Meetings: Not on file  . Marital Status: Not on file  Intimate Partner Violence:   . Fear of Current or Ex-Partner: Not on file  . Emotionally Abused: Not on file  . Physically Abused: Not on file  . Sexually Abused: Not on file    Outpatient Medications Prior to Visit  Medication Sig Dispense Refill  . azelastine (ASTELIN) 0.1 % nasal spray Place 2 sprays into both nostrils 2 (two) times daily. Use in each nostril as directed 30 mL 11  . cetirizine (ZYRTEC) 10 MG tablet Take 10 mg by mouth daily.      . Multiple Vitamin (MULTIVITAMIN) tablet Take 1 tablet by mouth daily.    . SUMAtriptan (IMITREX) 100 MG tablet TAKE 1 TABLET EVERY 2  HOURS AS NEEDED FOR MIGRAINE OR HEADACHE. MAY REPEAT IN 2 HOURS IF H/A REOCURS 10 tablet 5  . vitamin B-12 (CYANOCOBALAMIN) 1000 MCG tablet Take 1,000 mcg by mouth daily.     No facility-administered medications prior to visit.    Allergies  Allergen Reactions  . Hydrocodone     Hycodan cough syrup caused vomiting and headache    ROS Review of Systems  Constitutional: Negative for activity change, appetite change, fatigue, fever and unexpected weight change.  HENT: Negative for ear pain, hearing loss, sore throat and trouble swallowing.   Eyes: Negative for visual disturbance.  Respiratory: Negative for cough and shortness of breath.   Cardiovascular: Negative for chest pain and palpitations.  Gastrointestinal: Negative for abdominal pain, blood in stool, constipation and diarrhea.  Endocrine: Negative for polydipsia and polyuria.  Genitourinary: Negative for dysuria and hematuria.  Musculoskeletal: Negative for arthralgias, back pain and myalgias.  Skin: Negative for rash.  Neurological: Negative for dizziness, syncope and headaches.  Hematological: Negative for adenopathy.  Psychiatric/Behavioral: Negative for confusion and dysphoric mood.      Objective:    Physical Exam Constitutional:      Appearance: She is well-developed.  HENT:     Head: Normocephalic and atraumatic.     Right Ear: Tympanic membrane and ear canal normal.     Left Ear: Tympanic membrane and ear canal normal.     Mouth/Throat:     Mouth: Mucous membranes are moist.     Pharynx: Oropharynx is clear. No oropharyngeal exudate or posterior oropharyngeal erythema.  Eyes:     Pupils: Pupils are equal, round, and reactive to light.  Neck:     Thyroid: No thyromegaly.  Cardiovascular:     Rate and Rhythm: Normal rate and regular rhythm.     Heart sounds: Normal heart sounds. No murmur heard.   Pulmonary:     Effort: No respiratory distress.     Breath sounds: Normal breath sounds. No wheezing or  rales.  Abdominal:     General: Bowel sounds are normal. There is no distension.     Palpations: Abdomen is soft. There is no mass.     Tenderness: There is no abdominal tenderness. There is no guarding or rebound.  Genitourinary:    Comments: Per GYN Musculoskeletal:        General: Normal range of motion.     Cervical back: Normal range of motion and neck supple.  Lymphadenopathy:     Cervical: No cervical adenopathy.  Skin:    Findings: No rash.  Neurological:     Mental Status: She is alert  and oriented to person, place, and time.     Cranial Nerves: No cranial nerve deficit.  Psychiatric:        Behavior: Behavior normal.        Thought Content: Thought content normal.        Judgment: Judgment normal.     BP 120/78 (BP Location: Left Arm, Patient Position: Sitting, Cuff Size: Normal)   Pulse 77   Temp 98.1 F (36.7 C) (Oral)   Ht 5' 0.5" (1.537 m)   Wt 148 lb 6.4 oz (67.3 kg)   SpO2 99%   BMI 28.51 kg/m  Wt Readings from Last 3 Encounters:  11/02/20 148 lb 6.4 oz (67.3 kg)  09/11/19 148 lb 9.6 oz (67.4 kg)  10/02/17 142 lb 8 oz (64.6 kg)     Health Maintenance Due  Topic Date Due  . HIV Screening  Never done  . INFLUENZA VACCINE  07/10/2020    There are no preventive care reminders to display for this patient.  Lab Results  Component Value Date   TSH 2.04 09/11/2019   Lab Results  Component Value Date   WBC 6.9 09/11/2019   HGB 13.5 09/11/2019   HCT 40.0 09/11/2019   MCV 89.7 09/11/2019   PLT 310.0 09/11/2019   Lab Results  Component Value Date   NA 139 09/11/2019   K 4.4 09/11/2019   CO2 26 09/11/2019   GLUCOSE 89 09/11/2019   BUN 16 09/11/2019   CREATININE 0.95 09/11/2019   BILITOT 0.5 09/11/2019   ALKPHOS 49 09/11/2019   AST 20 09/11/2019   ALT 13 09/11/2019   PROT 7.1 09/11/2019   ALBUMIN 4.3 09/11/2019   CALCIUM 9.8 09/11/2019   GFR 61.12 09/11/2019   Lab Results  Component Value Date   CHOL 173 09/11/2019   Lab Results    Component Value Date   HDL 59.50 09/11/2019   Lab Results  Component Value Date   LDLCALC 96 09/11/2019   Lab Results  Component Value Date   TRIG 89.0 09/11/2019   Lab Results  Component Value Date   CHOLHDL 3 09/11/2019   No results found for: HGBA1C    Assessment & Plan:   Problem List Items Addressed This Visit    None    Visit Diagnoses    Physical exam    -  Primary   Relevant Orders   Basic metabolic panel   Lipid panel   CBC with Differential/Platelet   TSH   Hepatic function panel    -We recommended flu vaccine and she consents -We discussed Shingrix vaccine which she will consider. She wishes to wait because of potential side effects with this vaccine and Thanksgiving being tomorrow -Obtain screening labs -Continue regular weightbearing exercise -She plans to continue follow-up with GYN for mammograms -We agreed to Robaxin 500 mg nightly and also try some moist heat and muscle massage and topical sports creams for her back pain and be in touch if not improving in the next couple weeks  Meds ordered this encounter  Medications  . methocarbamol (ROBAXIN) 500 MG tablet    Sig: Take 1 tablet (500 mg total) by mouth every 8 (eight) hours as needed for muscle spasms.    Dispense:  20 tablet    Refill:  0    Follow-up: No follow-ups on file.    Carolann Littler, MD

## 2020-11-02 NOTE — Patient Instructions (Signed)

## 2020-11-03 LAB — CBC WITH DIFFERENTIAL/PLATELET
Absolute Monocytes: 512 cells/uL (ref 200–950)
Basophils Absolute: 50 cells/uL (ref 0–200)
Basophils Relative: 0.9 %
Eosinophils Absolute: 99 cells/uL (ref 15–500)
Eosinophils Relative: 1.8 %
HCT: 43.4 % (ref 35.0–45.0)
Hemoglobin: 14.7 g/dL (ref 11.7–15.5)
Lymphs Abs: 1887 cells/uL (ref 850–3900)
MCH: 30.2 pg (ref 27.0–33.0)
MCHC: 33.9 g/dL (ref 32.0–36.0)
MCV: 89.3 fL (ref 80.0–100.0)
MPV: 9.7 fL (ref 7.5–12.5)
Monocytes Relative: 9.3 %
Neutro Abs: 2954 cells/uL (ref 1500–7800)
Neutrophils Relative %: 53.7 %
Platelets: 333 10*3/uL (ref 140–400)
RBC: 4.86 10*6/uL (ref 3.80–5.10)
RDW: 12.1 % (ref 11.0–15.0)
Total Lymphocyte: 34.3 %
WBC: 5.5 10*3/uL (ref 3.8–10.8)

## 2020-11-03 LAB — HEPATIC FUNCTION PANEL
AG Ratio: 1.8 (calc) (ref 1.0–2.5)
ALT: 15 U/L (ref 6–29)
AST: 24 U/L (ref 10–35)
Albumin: 4.8 g/dL (ref 3.6–5.1)
Alkaline phosphatase (APISO): 62 U/L (ref 37–153)
Bilirubin, Direct: 0.1 mg/dL (ref 0.0–0.2)
Globulin: 2.7 g/dL (calc) (ref 1.9–3.7)
Indirect Bilirubin: 0.5 mg/dL (calc) (ref 0.2–1.2)
Total Bilirubin: 0.6 mg/dL (ref 0.2–1.2)
Total Protein: 7.5 g/dL (ref 6.1–8.1)

## 2020-11-03 LAB — LIPID PANEL
Cholesterol: 212 mg/dL — ABNORMAL HIGH (ref <200)
HDL: 78 mg/dL (ref >=50)
LDL Cholesterol (Calc): 117 mg/dL (calc) — ABNORMAL HIGH
Non-HDL Cholesterol (Calc): 134 mg/dL (calc) — ABNORMAL HIGH (ref <130)
Total CHOL/HDL Ratio: 2.7 (calc) (ref <5.0)
Triglycerides: 78 mg/dL (ref <150)

## 2020-11-03 LAB — BASIC METABOLIC PANEL
BUN/Creatinine Ratio: 17 (calc) (ref 6–22)
BUN: 19 mg/dL (ref 7–25)
CO2: 25 mmol/L (ref 20–32)
Calcium: 10.2 mg/dL (ref 8.6–10.4)
Chloride: 105 mmol/L (ref 98–110)
Creat: 1.09 mg/dL — ABNORMAL HIGH (ref 0.50–1.05)
Glucose, Bld: 91 mg/dL (ref 65–99)
Potassium: 4.8 mmol/L (ref 3.5–5.3)
Sodium: 144 mmol/L (ref 135–146)

## 2020-11-03 LAB — SPECIMEN COMPROMISED

## 2020-11-03 LAB — TSH: TSH: 1.76 mIU/L

## 2020-11-08 ENCOUNTER — Telehealth: Payer: Self-pay

## 2020-11-08 NOTE — Telephone Encounter (Signed)
-----   Message from Eulas Post, MD sent at 11/06/2020  6:47 PM EST ----- Labs all OK except for mildly elevated cholesterol (but also has excellent HDL).  Thyroid, CBC, and chemistries all normal.

## 2020-11-08 NOTE — Telephone Encounter (Signed)
Informed patient of results

## 2021-01-09 ENCOUNTER — Other Ambulatory Visit: Payer: 59

## 2021-01-09 DIAGNOSIS — Z20822 Contact with and (suspected) exposure to covid-19: Secondary | ICD-10-CM

## 2021-01-11 LAB — SARS-COV-2, NAA 2 DAY TAT

## 2021-01-11 LAB — NOVEL CORONAVIRUS, NAA: SARS-CoV-2, NAA: DETECTED — AB

## 2021-01-20 ENCOUNTER — Encounter: Payer: Self-pay | Admitting: Family Medicine

## 2021-02-02 ENCOUNTER — Other Ambulatory Visit: Payer: Self-pay

## 2021-02-03 ENCOUNTER — Encounter: Payer: Self-pay | Admitting: Family Medicine

## 2021-02-03 ENCOUNTER — Ambulatory Visit: Payer: 59 | Admitting: Family Medicine

## 2021-02-03 VITALS — BP 138/70 | HR 84 | Ht 60.5 in | Wt 149.0 lb

## 2021-02-03 DIAGNOSIS — M7662 Achilles tendinitis, left leg: Secondary | ICD-10-CM

## 2021-02-03 NOTE — Progress Notes (Signed)
Established Patient Office Visit  Subjective:  Patient ID: Ana Rivera, female    DOB: May 27, 1966  Age: 55 y.o. MRN: [id number]  CC:  Chief Complaint  Patient presents with  . Foot Problem    Left heel burning sensation since december    HPI [name] presents for left heel pain. She has noticed this since December. Denies any change of shoe wear. No difficulty with ambulation. She has had some plantar fasciitis in the past and has still some mild plantar fascia pain but this pain is different in location. She has not noted any visible swelling. Tenderness is over the retrocalcaneal region. No weakness. She tried topical sports cream without much improvement  Past Medical History:  Diagnosis Date  . Allergy     Past Surgical History:  Procedure Laterality Date  . ABDOMINAL HYSTERECTOMY  2003    Family History  Problem Relation Age of Onset  . Cancer Mother 79       melanoma  . Cancer Father 34       bladder and prostate  . Heart disease Father 70       CABG  . Stroke Father   . Diabetes Maternal Grandmother     Social History   Socioeconomic History  . Marital status: Married    Spouse name: Not on file  . Number of children: Not on file  . Years of education: Not on file  . Highest education level: Not on file  Occupational History  . Not on file  Tobacco Use  . Smoking status: Never Smoker  . Smokeless tobacco: Never Used  Substance and Sexual Activity  . Alcohol use: Yes    Comment: social  . Drug use: Not on file  . Sexual activity: Yes    Comment: married  Other Topics Concern  . Not on file  Social History Narrative  . Not on file   Social Determinants of Health   Financial Resource Strain: Not on file  Food Insecurity: Not on file  Transportation Needs: Not on file  Physical Activity: Not on file  Stress: Not on file  Social Connections: Not on file  Intimate Partner Violence: Not on file    Outpatient Medications Prior to Visit   Medication Sig Dispense Refill  . azelastine (ASTELIN) 0.1 % nasal spray Place 2 sprays into both nostrils 2 (two) times daily. Use in each nostril as directed 30 mL 11  . cetirizine (ZYRTEC) 10 MG tablet Take 10 mg by mouth daily.      . methocarbamol (ROBAXIN) 500 MG tablet Take 1 tablet (500 mg total) by mouth every 8 (eight) hours as needed for muscle spasms. 20 tablet 0  . Multiple Vitamin (MULTIVITAMIN) tablet Take 1 tablet by mouth daily.    . SUMAtriptan (IMITREX) 100 MG tablet TAKE 1 TABLET EVERY 2 HOURS AS NEEDED FOR MIGRAINE OR HEADACHE. MAY REPEAT IN 2 HOURS IF H/A REOCURS 10 tablet 5  . vitamin B-12 (CYANOCOBALAMIN) 1000 MCG tablet Take 1,000 mcg by mouth daily.     No facility-administered medications prior to visit.    Allergies  Allergen Reactions  . Hydrocodone     Hycodan cough syrup caused vomiting and headache    ROS Review of Systems  Neurological: Negative for weakness and numbness.      Objective:    Physical Exam Musculoskeletal:     Comments: Foot reveals no edema. She has full range of motion with plantarflexion and dorsiflexion. She has  tenderness over the distal Achilles tendon at the attachment of the calcaneus also has some mild retrocalcaneal bursa tenderness. No visible swelling. No erythema.     BP 138/70   Pulse 84   Ht 5' 0.5" (1.537 m)   Wt 149 lb (67.6 kg)   SpO2 97%   BMI 28.62 kg/m  Wt Readings from Last 3 Encounters:  02/03/21 149 lb (67.6 kg)  11/02/20 148 lb 6.4 oz (67.3 kg)  09/11/19 148 lb 9.6 oz (67.4 kg)     Health Maintenance Due  Topic Date Due  . HIV Screening  Never done  . COVID-19 Vaccine (2 - Booster for Janssen series) 06/04/2020    There are no preventive care reminders to display for this patient.  Lab Results  Component Value Date   TSH 1.76 11/02/2020   Lab Results  Component Value Date   WBC 5.5 11/02/2020   HGB 14.7 11/02/2020   HCT 43.4 11/02/2020   MCV 89.3 11/02/2020   PLT 333 11/02/2020    Lab Results  Component Value Date   NA 144 11/02/2020   K 4.8 11/02/2020   CO2 25 11/02/2020   GLUCOSE 91 11/02/2020   BUN 19 11/02/2020   CREATININE 1.09 (H) 11/02/2020   BILITOT 0.6 11/02/2020   ALKPHOS 49 09/11/2019   AST 24 11/02/2020   ALT 15 11/02/2020   PROT 7.5 11/02/2020   ALBUMIN 4.3 09/11/2019   CALCIUM 10.2 11/02/2020   GFR 61.12 09/11/2019   Lab Results  Component Value Date   CHOL 212 (H) 11/02/2020   Lab Results  Component Value Date   HDL 78 11/02/2020   Lab Results  Component Value Date   LDLCALC 117 (H) 11/02/2020   Lab Results  Component Value Date   TRIG 78 11/02/2020   Lab Results  Component Value Date   CHOLHDL 2.7 11/02/2020   No results found for: HGBA1C    Assessment & Plan:   Left foot pain. Suspect Achilles tendinitis and may have some retrocalcaneal bursitis  -Recommend trial of icing 15 to 20 minutes 2-3 times daily -Recommend trial over-the-counter Voltaren gel three times daily -Recommended some isometric exercises for her Achilles and calf muscles and touch base if not improving over the next several weeks  No orders of the defined types were placed in this encounter.   Follow-up: No follow-ups on file.    Carolann Littler, MD

## 2021-02-03 NOTE — Patient Instructions (Signed)
[employer]'s Emergency Medicine: Concepts and Clinical Practice (9th ed., pp. 1392-1401). [place], [place]: [employer]. Retrieved from https://www.clinicalkey.com/#!/content/[email or web address]">  Achilles Tendinitis  Achilles tendinitis is inflammation of the tough, cord-like band that attaches the lower leg muscles to the heel bone (Achilles tendon). This is usually caused by overusing the tendon and the ankle joint. Achilles tendinitis usually gets better over time with treatment and caring for yourself at home. It can take weeks or months to heal completely. What are the causes? This condition may be caused by:  A sudden increase in exercise or activity, such as running.  Doing the same exercises or activities, such as jumping, over and over.  Not warming up calf muscles before exercising.  Exercising in shoes that are worn out or not made for exercise.  Having arthritis or a bone growth (spur) on the back of the heel bone. This can rub against the tendon and hurt it.  Age-related wear and tear. Tendons become less flexible with age and are more likely to be injured. What are the signs or symptoms? Common symptoms of this condition include:  Pain in the Achilles tendon or in the back of the leg, just above the heel. The pain usually gets worse with exercise.  Stiffness or soreness in the back of the leg, especially in the morning.  Swelling of the skin over the Achilles tendon.  Thickening of the tendon.  Trouble standing on tiptoe. How is this diagnosed? This condition is diagnosed based on your symptoms and a physical exam. You may have tests, including:  X-rays.  MRI. How is this treated? The goal of treatment is to relieve symptoms and help your injury heal. Treatment may include:  Decreasing or stopping activities that caused the tendinitis. This may mean switching to low-impact exercises like biking or swimming.  Icing the injured  area.  Doing physical therapy, including strengthening and stretching exercises.  Taking NSAIDs, such as ibuprofen, to help relieve pain and swelling.  Using supportive shoes, wraps, heel lifts, or a walking boot (air cast).  Having surgery. This may be done if your symptoms do not improve after other treatments.  Using high-energy shock wave impulses to stimulate the healing process (extracorporeal shock wave therapy). This is rare.  Having an injection of medicines that help relieve inflammation (corticosteroids). This is rare. Follow these instructions at home: If you have an air cast:  Wear the air cast as told by your health care provider. Remove it only as told by your health care provider.  Loosen it if your toes tingle, become numb, or turn cold and blue.  Keep it clean.  If the air cast is not waterproof: ? Do not let it get wet. ? Cover it with a watertight covering when you take a bath or shower. Managing pain, stiffness, and swelling  If directed, put ice on the injured area. To do this: ? If you have a removable air cast, remove it as told by your health care provider. ? Put ice in a plastic bag. ? Place a towel between your skin and the bag. ? Leave the ice on for 20 minutes, 2-3 times a day.  Move your toes often to reduce stiffness and swelling.  Raise (elevate) your foot above the level of your heart while you are sitting or lying down.   Activity  Gradually return to your normal activities as told by your health care provider. Ask your health care provider what activities are safe for you.  Do not   do activities that cause pain.  Consider doing low-impact exercises, like cycling or swimming.  Ask your health care provider when it is safe to drive if you have an air cast on your foot.  If physical therapy was prescribed, do exercises as told by your health care provider or physical therapist. General instructions  If directed, wrap your foot with an  elastic bandage or other wrap. This can help to keep your tendon from moving too much while it heals. Your health care provider will show you how to wrap your foot correctly.  Wear supportive shoes or heel lifts only as told by your health care provider.  Take over-the-counter and prescription medicines only as told by your health care provider.  Keep all follow-up visits as told by your health care provider. This is important. Contact a health care provider if you:  Have symptoms that get worse.  Have pain that does not get better with medicine.  Develop new, unexplained symptoms.  Develop warmth and swelling in your foot.  Have a fever. Get help right away if you:  Have a sudden popping sound or sensation in your Achilles tendon followed by severe pain.  Cannot move your toes or foot.  Cannot put any weight on your foot.  Your foot or toes become numb and look white or blue even after loosening your bandage or air cast. Summary  Achilles tendinitis is inflammation of the tough, cord-like band that attaches the lower leg muscles to the heel bone (Achilles tendon).  This condition is usually caused by overusing the tendon and the ankle joint. It can also be caused by arthritis or normal aging.  The most common symptoms of this condition include pain, swelling, or stiffness in the Achilles tendon or in the back of the leg.  This condition is usually treated by decreasing or stopping activities that caused the tendinitis, icing the injured area, taking NSAIDs, and doing physical therapy. This information is not intended to replace advice given to you by your health care provider. Make sure you discuss any questions you have with your health care provider. Document Revised: 04/13/2019 Document Reviewed: 04/13/2019 Elsevier Patient Education  [place].  Consider trial of Diclofenac (Voltaren) gel 2-3 times daily as needed.

## 2021-02-27 ENCOUNTER — Encounter: Payer: Self-pay | Admitting: Family Medicine

## 2021-02-27 ENCOUNTER — Other Ambulatory Visit: Payer: Self-pay

## 2021-02-28 ENCOUNTER — Ambulatory Visit: Payer: 59 | Admitting: Family Medicine

## 2021-02-28 ENCOUNTER — Encounter: Payer: Self-pay | Admitting: Family Medicine

## 2021-02-28 VITALS — BP 126/80 | HR 97 | Temp 97.4°F | Ht 60.5 in | Wt 148.5 lb

## 2021-02-28 DIAGNOSIS — R0982 Postnasal drip: Secondary | ICD-10-CM

## 2021-02-28 DIAGNOSIS — E079 Disorder of thyroid, unspecified: Secondary | ICD-10-CM | POA: Diagnosis not present

## 2021-02-28 DIAGNOSIS — R6889 Other general symptoms and signs: Secondary | ICD-10-CM | POA: Diagnosis not present

## 2021-02-28 DIAGNOSIS — R0989 Other specified symptoms and signs involving the circulatory and respiratory systems: Secondary | ICD-10-CM

## 2021-02-28 NOTE — Patient Instructions (Signed)
I am setting up neck ultrasound and you should get a call within the next couple of weeks about the imaging.

## 2021-02-28 NOTE — Progress Notes (Signed)
Established Patient Office Visit  Subjective:  Patient ID: Ana Rivera, female    DOB: [birth year]  Age: 55 y.o. MRN: [id number]  CC:  Chief Complaint  Patient presents with  . throat clearing    HPI Ana Rivera presents for issue of sensation of needing to frequently "clear her throat" over the past several weeks.  She does have occasional postnasal drip takes Zyrtec daily.  Her postnasal drip symptoms are relatively mild.  She thinks she occasionally has GERD symptoms but not regularly.  She has had occasional sensation of dysphagia but not consistently.  No appetite or weight changes.  No hoarseness.  Her major concern is that she has chronic bilateral thyroid nodules predominantly right lower lobe of thyroid and had biopsy 2017 and she thinks this may be slowly growing.  Biopsy then showed benign follicle.  She does not have any family history of thyroid cancer.  Generally feels well otherwise.  No significant coughing.  Recent TSH in November was normal   Past Medical History:  Diagnosis Date  . Allergy     Past Surgical History:  Procedure Laterality Date  . ABDOMINAL HYSTERECTOMY  2003    Family History  Problem Relation Age of Onset  . Cancer Mother 51       melanoma  . Cancer Father 2       bladder and prostate  . Heart disease Father 31       CABG  . Stroke Father   . Diabetes Maternal Grandmother     Social History   Socioeconomic History  . Marital status: Married    Spouse name: Not on file  . Number of children: Not on file  . Years of education: Not on file  . Highest education level: Not on file  Occupational History  . Not on file  Tobacco Use  . Smoking status: Never Smoker  . Smokeless tobacco: Never Used  Substance and Sexual Activity  . Alcohol use: Yes    Comment: social  . Drug use: Not on file  . Sexual activity: Yes    Comment: married  Other Topics Concern  . Not on file  Social History Narrative  . Not on file   Social  Determinants of Health   Financial Resource Strain: Not on file  Food Insecurity: Not on file  Transportation Needs: Not on file  Physical Activity: Not on file  Stress: Not on file  Social Connections: Not on file  Intimate Partner Violence: Not on file    Outpatient Medications Prior to Visit  Medication Sig Dispense Refill  . azelastine (ASTELIN) 0.1 % nasal spray Place 2 sprays into both nostrils 2 (two) times daily. Use in each nostril as directed 30 mL 11  . cetirizine (ZYRTEC) 10 MG tablet Take 10 mg by mouth daily.    . methocarbamol (ROBAXIN) 500 MG tablet Take 1 tablet (500 mg total) by mouth every 8 (eight) hours as needed for muscle spasms. 20 tablet 0  . Multiple Vitamin (MULTIVITAMIN) tablet Take 1 tablet by mouth daily.    . SUMAtriptan (IMITREX) 100 MG tablet TAKE 1 TABLET EVERY 2 HOURS AS NEEDED FOR MIGRAINE OR HEADACHE. MAY REPEAT IN 2 HOURS IF H/A REOCURS 10 tablet 5  . vitamin B-12 (CYANOCOBALAMIN) 1000 MCG tablet Take 1,000 mcg by mouth daily.     No facility-administered medications prior to visit.    Allergies  Allergen Reactions  . Hydrocodone     Hycodan cough syrup  caused vomiting and headache    ROS Review of Systems  Constitutional: Negative for appetite change, chills, fever and unexpected weight change.  HENT: Negative for sinus pressure, sore throat and voice change.   Respiratory: Negative for cough and shortness of breath.   Cardiovascular: Negative for chest pain.  Hematological: Negative for adenopathy.      Objective:    Physical Exam Vitals reviewed.  Constitutional:      Appearance: Normal appearance.  Neck:     Comments: She has nodule right side of neck right lobe of thyroid which is by palpation appears to be at least 4 cm diameter.  This is nontender.  No neck adenopathy appreciated Cardiovascular:     Rate and Rhythm: Normal rate and regular rhythm.  Pulmonary:     Effort: Pulmonary effort is normal.     Breath sounds:  Normal breath sounds.  Musculoskeletal:     Cervical back: Neck supple.  Neurological:     Mental Status: She is alert.     BP 126/80   Pulse 97   Temp (!) 97.4 F (36.3 C) (Oral)   Ht 5' 0.5" (1.537 m)   Wt 148 lb 8 oz (67.4 kg)   SpO2 98%   BMI 28.52 kg/m  Wt Readings from Last 3 Encounters:  02/28/21 148 lb 8 oz (67.4 kg)  02/03/21 149 lb (67.6 kg)  11/02/20 148 lb 6.4 oz (67.3 kg)     Health Maintenance Due  Topic Date Due  . HIV Screening  Never done  . COVID-19 Vaccine (2 - Booster for Janssen series) 06/04/2020    There are no preventive care reminders to display for this patient.  Lab Results  Component Value Date   TSH 1.76 11/02/2020   Lab Results  Component Value Date   WBC 5.5 11/02/2020   HGB 14.7 11/02/2020   HCT 43.4 11/02/2020   MCV 89.3 11/02/2020   PLT 333 11/02/2020   Lab Results  Component Value Date   NA 144 11/02/2020   K 4.8 11/02/2020   CO2 25 11/02/2020   GLUCOSE 91 11/02/2020   BUN 19 11/02/2020   CREATININE 1.09 (H) 11/02/2020   BILITOT 0.6 11/02/2020   ALKPHOS 49 09/11/2019   AST 24 11/02/2020   ALT 15 11/02/2020   PROT 7.5 11/02/2020   ALBUMIN 4.3 09/11/2019   CALCIUM 10.2 11/02/2020   GFR 61.12 09/11/2019   Lab Results  Component Value Date   CHOL 212 (H) 11/02/2020   Lab Results  Component Value Date   HDL 78 11/02/2020   Lab Results  Component Value Date   LDLCALC 117 (H) 11/02/2020   Lab Results  Component Value Date   TRIG 78 11/02/2020   Lab Results  Component Value Date   CHOLHDL 2.7 11/02/2020   No results found for: HGBA1C    Assessment & Plan:   #1 frequent sensation of needing to clear her throat.  Explained this could be related to possibly postnasal drip or possibly GERD symptoms.  She has concerns whether this is related to her thyroid (see below).    -We suggested that she consider adding Flonase or Nasacort to Zyrtec to reduce postnasal drip. -Consider empiric trial of PPI for reflux  but would try to avoid prolonged use  #2 predominant right lobe thyroid nodule.  Biopsied 5 years ago and negative.  Patient concerned this may be slowly growing in size.  -Set up repeat ultrasound neck to further evaluate  No orders  of the defined types were placed in this encounter.   Follow-up: No follow-ups on file.    Carolann Littler, MD

## 2021-03-15 ENCOUNTER — Ambulatory Visit
Admission: RE | Admit: 2021-03-15 | Discharge: 2021-03-15 | Disposition: A | Discharge status: Home / Self Care | Payer: 59 | Source: Ambulatory Visit | Attending: Family Medicine | Admitting: Family Medicine

## 2021-03-15 ENCOUNTER — Other Ambulatory Visit: Payer: Self-pay

## 2021-03-15 DIAGNOSIS — E079 Disorder of thyroid, unspecified: Secondary | ICD-10-CM

## 2021-05-24 ENCOUNTER — Encounter: Payer: Self-pay | Admitting: Family Medicine

## 2021-05-30 ENCOUNTER — Ambulatory Visit: Payer: 59 | Admitting: Podiatry

## 2021-05-30 ENCOUNTER — Other Ambulatory Visit: Payer: Self-pay

## 2021-05-30 ENCOUNTER — Ambulatory Visit (INDEPENDENT_AMBULATORY_CARE_PROVIDER_SITE_OTHER): Payer: 59

## 2021-05-30 VITALS — BP 141/89 | Temp 97.7°F

## 2021-05-30 DIAGNOSIS — M7662 Achilles tendinitis, left leg: Secondary | ICD-10-CM | POA: Diagnosis not present

## 2021-05-30 DIAGNOSIS — M79672 Pain in left foot: Secondary | ICD-10-CM

## 2021-05-30 DIAGNOSIS — M722 Plantar fascial fibromatosis: Secondary | ICD-10-CM | POA: Diagnosis not present

## 2021-05-30 MED ORDER — MELOXICAM 15 MG PO TABS: 15.0000 mg | ORAL_TABLET | Freq: Every day | ORAL | 0 refills | Status: DC

## 2021-05-30 NOTE — Patient Instructions (Signed)

## 2021-05-31 ENCOUNTER — Ambulatory Visit: Payer: 59 | Admitting: Family Medicine

## 2021-05-31 ENCOUNTER — Ambulatory Visit (INDEPENDENT_AMBULATORY_CARE_PROVIDER_SITE_OTHER): Payer: 59

## 2021-05-31 DIAGNOSIS — Z23 Encounter for immunization: Secondary | ICD-10-CM

## 2021-06-07 NOTE — Progress Notes (Signed)
Subjective:   Patient ID: Ana Rivera, female   DOB: 55 y.o.   MRN: [id number]   HPI 55 year old female presents the office today for concerns of left foot pain.  She describes heel pain on the bottom of the heel.  She has tried ice as well as Voltaren.  She does not get pain every day.  She states that she has been doing a fitness class at [facility] and not sure if this is what started.  No recent injury.  This is been ongoing since February.  No swelling.  She gets discomfort if she sits or in the morning when she first gets up and as she walks it feels better.   Review of Systems  All other systems reviewed and are negative.  Past Medical History:  Diagnosis Date   Allergy     Past Surgical History:  Procedure Laterality Date   ABDOMINAL HYSTERECTOMY  2003     Current Outpatient Medications:    meloxicam (MOBIC) 15 MG tablet, Take 1 tablet (15 mg total) by mouth daily., Disp: 14 tablet, Rfl: 0   azelastine (ASTELIN) 0.1 % nasal spray, Place 2 sprays into both nostrils 2 (two) times daily. Use in each nostril as directed, Disp: 30 mL, Rfl: 11   cetirizine (ZYRTEC) 10 MG tablet, Take 10 mg by mouth daily., Disp: , Rfl:    methocarbamol (ROBAXIN) 500 MG tablet, Take 1 tablet (500 mg total) by mouth every 8 (eight) hours as needed for muscle spasms., Disp: 20 tablet, Rfl: 0   Multiple Vitamin (MULTIVITAMIN) tablet, Take 1 tablet by mouth daily., Disp: , Rfl:    SUMAtriptan (IMITREX) 100 MG tablet, TAKE 1 TABLET EVERY 2 HOURS AS NEEDED FOR MIGRAINE OR HEADACHE. MAY REPEAT IN 2 HOURS IF H/A REOCURS, Disp: 10 tablet, Rfl: 5   vitamin B-12 (CYANOCOBALAMIN) 1000 MCG tablet, Take 1,000 mcg by mouth daily., Disp: , Rfl:   Allergies  Allergen Reactions   Hydrocodone     Hycodan cough syrup caused vomiting and headache          Objective:  Physical Exam  General: AAO x3, NAD  Dermatological: Skin is warm, dry and supple bilateral.  There are no open sores, no preulcerative lesions,  no rash or signs of infection present.  Vascular: Dorsalis Pedis artery and Posterior Tibial artery pedal pulses are 2/4 bilateral with immedate capillary fill time. There is no pain with calf compression, swelling, warmth, erythema.   Neruologic: Grossly intact via light touch bilateral.   Musculoskeletal: Mild tenderness palpation on plantar medial tubercle of the calcaneus at the insertion of plantar fascial left side.  Mild discomfort the posterior aspect calcaneus and insertion of the Achilles tendon.  Plantar fascial and Achilles tendon appear to be intact.  There is no area of pinpoint tenderness no pain with lateral compression of calcaneus.  No other areas of discomfort identified.  Muscular strength 5/5 in all groups tested bilateral.  Gait: Unassisted, Nonantalgic.       Assessment:   Left heel pain, plantar fasciitis/insertional Achilles tendonitis      Plan:  -Treatment options discussed including all alternatives, risks, and complications -Etiology of symptoms were discussed -X-rays were obtained and reviewed with the patient.  No evidence of acute fracture identified. -Prescribed mobic. Discussed side effects of the medication and directed to stop if any are to occur and call the office.  -Night splint -Heel lift -Discussed stretching, icing daily.  Discussed shoe modifications and arch supports.  Rodman Key  Lynwood Dawley DPM

## 2021-06-27 ENCOUNTER — Other Ambulatory Visit: Payer: Self-pay | Admitting: Podiatry

## 2021-06-27 NOTE — Telephone Encounter (Signed)
Please advise 

## 2021-07-11 ENCOUNTER — Encounter: Payer: Self-pay | Admitting: Podiatry

## 2021-07-11 ENCOUNTER — Other Ambulatory Visit: Payer: Self-pay

## 2021-07-11 ENCOUNTER — Ambulatory Visit: Payer: 59 | Admitting: Podiatry

## 2021-07-11 DIAGNOSIS — M722 Plantar fascial fibromatosis: Secondary | ICD-10-CM | POA: Diagnosis not present

## 2021-07-11 DIAGNOSIS — M7662 Achilles tendinitis, left leg: Secondary | ICD-10-CM

## 2021-07-11 NOTE — Patient Instructions (Signed)

## 2021-07-14 NOTE — Progress Notes (Signed)
Subjective: 55 year old female presents the office today for follow-up evaluation of left heel pain, plantar fasciitis/insertional Achilles tendinitis.  She is overall she is doing much better.  She has been doing stretching as well as she took meloxicam.  No recent injury or trauma.  She is still doing exercise classes but they are making some modifications to help avoid pain.  Denies any systemic complaints such as fevers, chills, nausea, vomiting. No acute changes since last appointment, and no other complaints at this time.   Objective: AAO x3, NAD DP/PT pulses palpable bilaterally, CRT less than 3 seconds On today's exam there is no significant tenderness along plantar medial tubercle of the calcaneus at insertion of plantar fascia.  There is no pain on the posterior calcaneus along the Achilles tendon.  No area of pinpoint tenderness.  No pain with lateral compression of calcaneus.  No edema, erythema.  MMT 5/5. No open lesions or pre-ulcerative lesions.  No pain with calf compression, swelling, warmth, erythema  Assessment: Resolving left heel pain  Plan: -All treatment options discussed with the patient including all alternatives, risks, complications.  -Says she seems to do much better.  Discussed treatments to prevent reoccurrence.  Continue stretching, icing daily.  Discussed shoes and good arch supports to prevent reoccurrence long-term.  If she starts to have recurrence of symptoms anti-inflammatories as needed and let me know.  Otherwise I will see her back as needed. -Patient encouraged to call the office with any questions, concerns, change in symptoms.   Trula Slade DPM

## 2021-08-26 ENCOUNTER — Other Ambulatory Visit: Payer: Self-pay | Admitting: Podiatry

## 2021-08-31 ENCOUNTER — Ambulatory Visit: Payer: 59

## 2021-09-01 ENCOUNTER — Other Ambulatory Visit: Payer: Self-pay

## 2021-09-01 ENCOUNTER — Ambulatory Visit (INDEPENDENT_AMBULATORY_CARE_PROVIDER_SITE_OTHER): Payer: 59

## 2021-09-01 DIAGNOSIS — Z23 Encounter for immunization: Secondary | ICD-10-CM
